# Patient Record
Sex: Female | Born: 1940 | Race: White | Hispanic: No | Marital: Married | Smoking: Never smoker
Health system: Southern US, Community
[De-identification: ages and names within clinical notes are randomized; demographics above are authoritative.]

## PROBLEM LIST (undated history)

## (undated) DIAGNOSIS — M1990 Unspecified osteoarthritis, unspecified site: Secondary | ICD-10-CM

## (undated) DIAGNOSIS — K8020 Calculus of gallbladder without cholecystitis without obstruction: Secondary | ICD-10-CM

## (undated) DIAGNOSIS — K8050 Calculus of bile duct without cholangitis or cholecystitis without obstruction: Secondary | ICD-10-CM

## (undated) DIAGNOSIS — H409 Unspecified glaucoma: Secondary | ICD-10-CM

## (undated) DIAGNOSIS — Z973 Presence of spectacles and contact lenses: Secondary | ICD-10-CM

## (undated) DIAGNOSIS — G250 Essential tremor: Secondary | ICD-10-CM

## (undated) DIAGNOSIS — C4491 Basal cell carcinoma of skin, unspecified: Secondary | ICD-10-CM

## (undated) HISTORY — DX: Basal cell carcinoma of skin, unspecified: C44.91

## (undated) HISTORY — PX: TONSILLECTOMY AND ADENOIDECTOMY: SUR1326

## (undated) HISTORY — PX: WRIST GANGLION EXCISION: SUR520

## (undated) HISTORY — PX: COLONOSCOPY: SHX174

## (undated) HISTORY — PX: TUBAL LIGATION: SHX77

## (undated) HISTORY — DX: Essential tremor: G25.0

---

## 1981-11-05 HISTORY — PX: LUMBAR SPINE SURGERY: SHX701

## 1991-11-06 HISTORY — PX: VAGINAL HYSTERECTOMY: SUR661

## 1999-01-27 ENCOUNTER — Other Ambulatory Visit: Admission: RE | Admit: 1999-01-27 | Discharge: 1999-01-27 | Payer: Self-pay | Admitting: Obstetrics and Gynecology

## 2000-06-07 ENCOUNTER — Encounter: Payer: Self-pay | Admitting: Family Medicine

## 2000-06-07 ENCOUNTER — Encounter: Admission: RE | Admit: 2000-06-07 | Discharge: 2000-06-07 | Payer: Self-pay | Admitting: Family Medicine

## 2000-06-13 ENCOUNTER — Encounter: Admission: RE | Admit: 2000-06-13 | Discharge: 2000-06-13 | Payer: Self-pay | Admitting: Family Medicine

## 2000-06-13 ENCOUNTER — Encounter: Payer: Self-pay | Admitting: Family Medicine

## 2001-03-12 ENCOUNTER — Encounter: Payer: Self-pay | Admitting: Family Medicine

## 2001-03-12 ENCOUNTER — Encounter: Admission: RE | Admit: 2001-03-12 | Discharge: 2001-03-12 | Payer: Self-pay | Admitting: Family Medicine

## 2001-10-16 ENCOUNTER — Ambulatory Visit (HOSPITAL_COMMUNITY): Admission: RE | Admit: 2001-10-16 | Discharge: 2001-10-16 | Payer: Self-pay | Admitting: Gastroenterology

## 2002-01-28 ENCOUNTER — Emergency Department (HOSPITAL_COMMUNITY): Admission: EM | Admit: 2002-01-28 | Discharge: 2002-01-28 | Payer: Self-pay

## 2002-01-28 ENCOUNTER — Encounter: Payer: Self-pay | Admitting: *Deleted

## 2002-06-01 ENCOUNTER — Other Ambulatory Visit: Admission: RE | Admit: 2002-06-01 | Discharge: 2002-06-01 | Payer: Self-pay | Admitting: Obstetrics and Gynecology

## 2003-10-13 ENCOUNTER — Encounter: Admission: RE | Admit: 2003-10-13 | Discharge: 2003-10-13 | Payer: Self-pay | Admitting: Family Medicine

## 2005-12-03 ENCOUNTER — Encounter: Admission: RE | Admit: 2005-12-03 | Discharge: 2005-12-03 | Payer: Self-pay | Admitting: Family Medicine

## 2006-01-07 ENCOUNTER — Encounter: Admission: RE | Admit: 2006-01-07 | Discharge: 2006-01-07 | Payer: Self-pay | Admitting: Neurology

## 2006-08-28 ENCOUNTER — Encounter: Admission: RE | Admit: 2006-08-28 | Discharge: 2006-08-28 | Payer: Self-pay | Admitting: Family Medicine

## 2006-09-09 ENCOUNTER — Encounter: Admission: RE | Admit: 2006-09-09 | Discharge: 2006-09-09 | Payer: Self-pay | Admitting: Family Medicine

## 2011-03-21 ENCOUNTER — Emergency Department (HOSPITAL_COMMUNITY): Payer: Medicare Other

## 2011-03-21 ENCOUNTER — Emergency Department (HOSPITAL_COMMUNITY)
Admission: EM | Admit: 2011-03-21 | Discharge: 2011-03-21 | Disposition: A | Discharge status: Home / Self Care | Payer: Medicare Other | Attending: Emergency Medicine | Admitting: Emergency Medicine

## 2011-03-21 DIAGNOSIS — R5381 Other malaise: Secondary | ICD-10-CM | POA: Insufficient documentation

## 2011-03-21 DIAGNOSIS — G250 Essential tremor: Secondary | ICD-10-CM | POA: Insufficient documentation

## 2011-03-21 DIAGNOSIS — F410 Panic disorder [episodic paroxysmal anxiety] without agoraphobia: Secondary | ICD-10-CM | POA: Insufficient documentation

## 2011-03-21 DIAGNOSIS — R61 Generalized hyperhidrosis: Secondary | ICD-10-CM | POA: Insufficient documentation

## 2011-03-21 DIAGNOSIS — F3289 Other specified depressive episodes: Secondary | ICD-10-CM | POA: Insufficient documentation

## 2011-03-21 DIAGNOSIS — F329 Major depressive disorder, single episode, unspecified: Secondary | ICD-10-CM | POA: Insufficient documentation

## 2011-03-21 DIAGNOSIS — R5383 Other fatigue: Secondary | ICD-10-CM | POA: Insufficient documentation

## 2011-03-21 DIAGNOSIS — R110 Nausea: Secondary | ICD-10-CM | POA: Insufficient documentation

## 2011-03-21 DIAGNOSIS — Z79899 Other long term (current) drug therapy: Secondary | ICD-10-CM | POA: Insufficient documentation

## 2011-03-21 LAB — BASIC METABOLIC PANEL
BUN: 11 mg/dL (ref 6–23)
Calcium: 9.2 mg/dL (ref 8.4–10.5)
Creatinine, Ser: 0.66 mg/dL (ref 0.4–1.2)
GFR calc Af Amer: >60 mL/min (ref >60)
GFR calc non Af Amer: >60 mL/min (ref >60)

## 2011-03-21 LAB — URINALYSIS, ROUTINE W REFLEX MICROSCOPIC
Glucose, UA: NEGATIVE mg/dL
Nitrite: NEGATIVE
Specific Gravity, Urine: 1.013 (ref 1.005–1.030)
pH: 8.5 — ABNORMAL HIGH (ref 5.0–8.0)

## 2011-03-21 LAB — CBC
MCH: 29.5 pg (ref 26.0–34.0)
MCHC: 32.8 g/dL (ref 30.0–36.0)
MCV: 89.8 fL (ref 78.0–100.0)
Platelets: 182 10*3/uL (ref 150–400)
RBC: 4.41 MIL/uL (ref 3.87–5.11)

## 2011-03-21 LAB — DIFFERENTIAL
Basophils Relative: 1 % (ref 0–1)
Eosinophils Absolute: 0.1 10*3/uL (ref 0.0–0.7)
Lymphs Abs: 1.2 10*3/uL (ref 0.7–4.0)
Monocytes Absolute: 0.3 10*3/uL (ref 0.1–1.0)
Monocytes Relative: 8 % (ref 3–12)
Neutrophils Relative %: 59 % (ref 43–77)

## 2011-03-21 LAB — POCT CARDIAC MARKERS: Troponin i, poc: <0.05 ng/mL (ref 0.00–0.09)

## 2011-03-21 LAB — URINE MICROSCOPIC-ADD ON

## 2011-03-23 LAB — URINE CULTURE

## 2012-12-08 ENCOUNTER — Ambulatory Visit: Payer: Medicare Other | Attending: Family Medicine

## 2012-12-08 DIAGNOSIS — M545 Low back pain, unspecified: Secondary | ICD-10-CM | POA: Insufficient documentation

## 2012-12-08 DIAGNOSIS — IMO0001 Reserved for inherently not codable concepts without codable children: Secondary | ICD-10-CM | POA: Insufficient documentation

## 2012-12-08 DIAGNOSIS — M25559 Pain in unspecified hip: Secondary | ICD-10-CM | POA: Insufficient documentation

## 2012-12-11 ENCOUNTER — Ambulatory Visit: Payer: Medicare Other

## 2012-12-15 ENCOUNTER — Ambulatory Visit: Payer: Medicare Other | Admitting: Physical Therapy

## 2012-12-18 ENCOUNTER — Ambulatory Visit: Payer: Medicare Other

## 2012-12-29 ENCOUNTER — Ambulatory Visit: Payer: Medicare Other | Admitting: Physical Therapy

## 2013-01-06 ENCOUNTER — Ambulatory Visit: Payer: Medicare Other | Attending: Family Medicine

## 2013-01-06 DIAGNOSIS — M545 Low back pain, unspecified: Secondary | ICD-10-CM | POA: Insufficient documentation

## 2013-01-06 DIAGNOSIS — M25559 Pain in unspecified hip: Secondary | ICD-10-CM | POA: Insufficient documentation

## 2013-01-06 DIAGNOSIS — IMO0001 Reserved for inherently not codable concepts without codable children: Secondary | ICD-10-CM | POA: Insufficient documentation

## 2013-07-21 ENCOUNTER — Other Ambulatory Visit: Payer: Self-pay

## 2013-07-21 MED ORDER — PRIMIDONE 250 MG PO TABS: 125.0000 mg | ORAL_TABLET | Freq: Every day | ORAL | Status: DC | PRN

## 2013-07-21 NOTE — Telephone Encounter (Signed)
Former [facility] patient assigned to Dr Frances Furbish per [employer].  Last OV was March 2014, patient was asked to return in one year.

## 2014-01-08 ENCOUNTER — Ambulatory Visit: Payer: Self-pay | Admitting: Neurology

## 2014-01-27 ENCOUNTER — Encounter: Payer: Self-pay | Admitting: Neurology

## 2014-01-28 ENCOUNTER — Encounter (INDEPENDENT_AMBULATORY_CARE_PROVIDER_SITE_OTHER): Payer: Self-pay

## 2014-01-28 ENCOUNTER — Encounter: Payer: Self-pay | Admitting: Neurology

## 2014-01-28 ENCOUNTER — Ambulatory Visit (INDEPENDENT_AMBULATORY_CARE_PROVIDER_SITE_OTHER): Payer: Medicare Other | Admitting: Neurology

## 2014-01-28 VITALS — BP 137/72 | HR 85 | Temp 97.2°F | Ht 67.0 in | Wt 120.0 lb

## 2014-01-28 DIAGNOSIS — G252 Other specified forms of tremor: Secondary | ICD-10-CM

## 2014-01-28 DIAGNOSIS — G250 Essential tremor: Secondary | ICD-10-CM

## 2014-01-28 NOTE — Patient Instructions (Signed)
  Please remember, that any kind of tremor may be exacerbated by anxiety, anger, nervousness, excitement, dehydration, sleep deprivation, by caffeine, and low blood sugar values or blood sugar fluctuations. Some medications, especially some antidepressants and lithium can cause or exacerbate tremors. Tremors may temporarily calm down her subside with the use of a benzodiazepine such as Valium or related medications and with alcohol. Be aware however that drinking alcohol is not an approved treatment or appropriate treatment for tremor control and long-term use of benzodiazepines such as Valium, lorazepam, alprazolam, or clonazepam can cause habit formation, physical and psychological addiction.  

## 2014-01-28 NOTE — Progress Notes (Addendum)
Subjective:    Holt ID: April Holt Holt is a 73 y.o. female.  HPI    Interim history:   April Holt Holt is a very pleasant 73 year old right-handed woman with an underlying medical history of seasonal allergies, and glaucoma, who presents for followup consultation of her essential tremor, affecting primarily her head. She is unaccompanied today. This is her first visit with me and she previously followed with Dr. Morene Holt and was last seen by him on 01/06/2013, and which time he felt she was stable and suggested a yearly checkup. She has been on Mysoline for symptomatic treatment of her tremors. Today, she reports feeling stable. No significant new illness is reported, except for glaucoma. She has noted not SE with April Holt primidone and takes 250 mg 1/2 pill in AM.   I reviewed Dr. Tressia Holt prior notes and records and below is a summary of that review: She was first seen by Dr. Erling Holt on 05/13/2000 at April Holt request of Dr. Jennelle Holt for essential tremor. She has a history of head and neck tremor noted while in April Holt choir as a teenager. It has been slowly progressive but has responded well to Mysoline. She has a family history of tremor in her father and paternal grandfather. She has had April Holt tremor for over 50 years, made worse by caffeine and improved with Mysoline. She has been on April Holt Mysoline since 2001. She had left leg pain in March 2007 and had a lumbar MRI, which showed facet arthropathy bilaterally at L2-3 to L4-5 with asymmetry at L3-4 and more pronounced arthropathy on April Holt left, creating a mild lateral recess stenosis at that level, which might entrap April Holt left L4 nerve root, and left L5-S1 hemilaminectomy defect. 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 On 01/13/2008, she had normal vitamin D, CBC, and CMP. In October 2010 she was sitting, writing at her desk and felt like she was going to faint. It went away in seconds. This occurred once at age 30 years.April Holt episode in October was unusual. She felt a  sensation as if she couldn't catch her breath and she had to let her neck go down and head go down. One week after April Holt episode in October, she had a strange sensation In her throat and was seen by a cardiologist. She underwent a 24 hour monitor which was normal. She has not had any syncopal episodes since then. DEXA scan in 2010 showed osteopenia in April Holt left forearm. Repeat 2013 showed improvement. Blood work on 12/08/2010 showed normal glucose and urinalysis. She exercises by doing yoga and walking. She had anxiety in 01/2011 and was placed on 10 mg of citalpropram per day. At April Holt time she was selling her house. She weaned off of citalopram as she was noted to have a head tremor. She denies side effects from mysoline. She is a retired Pharmacist, hospital. 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 She is independent in her ADLs. She denies memory loss, depression, delirium, hallucinations, anxiety,or falling episodes.  Her Past Medical History Is Significant For: Past Medical History  Diagnosis Date  . Disturbance of skin sensation   . Lumbosacral root lesions, not elsewhere classified   . Essential and other specified forms of tremor   . Essential tremor 01/28/2014    Her Past Surgical History Is Significant For: Past Surgical History  Procedure Laterality Date  . Vaginal hysterectomy    . Lumbar spine surgery  1983    L5-S1  . Tonsilectomy/adenoidectomy with myringotomy  Her Family History Is Significant For: Family History  Problem Relation Age of Onset  . Heart failure Father   . Stroke Mother   . Asthma Sister     Her Social History Is Significant For: History   Social History  . Marital Status: Married    Spouse Name: N/A    Number of Children: N/A  . Years of Education: N/A   Occupational History  . retired    Social History Main Topics  . Smoking status: Never Smoker   . Smokeless tobacco: None  . Alcohol Use: 2.0 oz/week    4 drink(s) per week  . Drug Use: No  . Sexual Activity: None    Other Topics Concern  . None   Social History Narrative   Pt lives at home with her husband of 23 years, April Holt Holt; they have 6 children. 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    Her Allergies Are:  Allergies  Allergen Reactions  . Penicillins   . Sulfa Antibiotics   :   Her Current Medications Are:  Outpatient Encounter Prescriptions as of 01/28/2014  Medication Sig  . latanoprost (XALATAN) 0.005 % ophthalmic solution Place 1 drop into both eyes daily.  . naproxen (NAPROSYN) 500 MG tablet Take 2 tablets by mouth daily.  . primidone (MYSOLINE) 250 MG tablet Take 0.5-1 tablets (125-250 mg total) by mouth daily as needed.  . [DISCONTINUED] PHENOBARBITAL PO Take by mouth.  :  Review of Systems:  Out of a complete 14 point review of systems, all are reviewed and negative with April Holt exception of these symptoms as listed below:  Review of Systems  Constitutional: Negative.   HENT: Positive for rhinorrhea.   Eyes: Negative.   Respiratory: Negative.   Cardiovascular: Positive for leg swelling.  Gastrointestinal: Negative.   Endocrine: Negative.   Genitourinary: Negative.   Musculoskeletal: Positive for arthralgias.  Skin: Negative.   Allergic/Immunologic: Positive for environmental allergies.  Neurological: Positive for tremors.  Hematological: Bruises/bleeds easily.  Psychiatric/Behavioral: Negative.     Objective:  Neurologic Exam  Physical Exam Physical Examination:   Filed Vitals:   01/28/14 1517  BP: 137/72  Pulse: 85  Temp: 97.2 F (36.2 C)    General Examination: April Holt Holt is a very pleasant 73 y.o. female in no acute distress. She appears well-developed and well-nourished and well groomed.    HEENT: Normocephalic, atraumatic, pupils are equal, round and reactive to light and accommodation. Funduscopic exam is normal with sharp disc margins noted. Extraocular tracking is good without limitation to gaze excursion or nystagmus noted. Normal smooth  pursuit is noted. Hearing is grossly intact. Tympanic membranes are clear bilaterally. Face is symmetric with normal facial animation and normal facial sensation. Speech is clear with no dysarthria noted. There is no hypophonia. There is a mild, yes-yes type neck/head tremor and a slight voice tremor. Neck is supple with full range of passive and active motion. There are no carotid bruits on auscultation. Oropharynx exam reveals: mild mouth dryness, adequate dental hygiene and no significant airway crowding. Mallampati is class I. Tongue protrudes centrally and palate elevates symmetrically. Tonsils are absent.    Chest: Clear to auscultation without wheezing, rhonchi or crackles noted.  Heart: S1+S2+0, regular and normal without murmurs, rubs or gallops noted.   Abdomen: Soft, non-tender and non-distended with normal bowel sounds appreciated on auscultation.  Extremities: There is trace pitting edema in April Holt distal lower extremities bilaterally, around April Holt ankles, L>R. Pedal pulses are intact.  Skin: Warm and dry without trophic changes noted. There are no varicose veins.  Musculoskeletal: exam reveals no obvious joint deformities, tenderness or joint swelling or erythema.   Neurologically:  Mental status: April Holt Holt is awake, alert and oriented in all 4 spheres. Her immediate and remote memory, attention, language skills and fund of knowledge are appropriate. There is no evidence of aphasia, agnosia, apraxia or anomia. Speech is clear with normal prosody and enunciation. Thought process is linear. Mood is normal and affect is normal.  Cranial nerves II - XII are as described above under HEENT exam. In addition: shoulder shrug is normal with equal shoulder height noted. Motor exam: Normal bulk, strength and tone is noted. There is no drift, tremor or rebound. Romberg is negative. Reflexes are 2+ throughout. Babinski: Toes are flexor bilaterally. Fine motor skills and coordination: intact with  normal finger taps, normal hand movements, normal rapid alternating patting, normal foot taps and normal foot agility.  Cerebellar testing: No dysmetria or intention tremor on finger to nose testing. Heel to shin is unremarkable bilaterally. There is no truncal or gait ataxia.  Sensory exam: intact to light touch, pinprick, vibration, temperature sense and proprioception in April Holt upper and lower extremities.  Gait, station and balance: She stands easily. No veering to one side is noted. No leaning to one side is noted. Posture is age-appropriate and stance is narrow based. Gait shows normal stride length and normal pace. No problems turning are noted. She turns en bloc. Tandem walk is unremarkable. Intact toe and heel stance is noted.                Assessment and Plan:   In summary, April Holt Holt is a very pleasant 73 y.o.-year old female with a fairly benign past medical history except for glaucoma, who has a long-standing history of essential tremor which manifests primarily as a head tremor. She has a positive family history of tremors. Her physical exam appears to be quite stable when I compare my findings with last years findings when she saw Dr. Erling Holt. She has had reasonably good results with Mysoline 125 mg daily and reports no side effects at this time. I would like to continue with April Holt current medication and suggested a yearly checkup as she continues to do well. She was in agreement. She did not need a refill today but I will take care of it when it comes to renewing her prescription. I encouraged her to call with any interim questions, concerns, problems, updates or refill requests.

## 2014-07-26 ENCOUNTER — Other Ambulatory Visit: Payer: Self-pay | Admitting: Neurology

## 2015-02-01 ENCOUNTER — Ambulatory Visit: Payer: Medicare Other | Admitting: Neurology

## 2015-02-08 ENCOUNTER — Ambulatory Visit (INDEPENDENT_AMBULATORY_CARE_PROVIDER_SITE_OTHER): Payer: Medicare Other | Admitting: Neurology

## 2015-02-08 ENCOUNTER — Encounter: Payer: Self-pay | Admitting: Neurology

## 2015-02-08 VITALS — BP 103/60 | HR 80 | Temp 97.9°F | Resp 14 | Ht 67.0 in | Wt 124.0 lb

## 2015-02-08 DIAGNOSIS — G250 Essential tremor: Secondary | ICD-10-CM | POA: Diagnosis not present

## 2015-02-08 MED ORDER — PRIMIDONE 250 MG PO TABS: ORAL_TABLET | ORAL | Status: DC

## 2015-02-08 NOTE — Progress Notes (Signed)
Subjective:    Patient ID: April Holt is a 74 y.o. female.  HPI     Interim history:   April Holt is a very pleasant 74 year old right-handed woman with an underlying medical history of seasonal allergies, and glaucoma, who presents for followup consultation of her essential tremor, affecting primarily her head. She is unaccompanied today. I first met her on 01/28/2014, at which time she reported feeling stable. She had no new illness but was diagnosed with glaucoma. She had no side effects with the primidone and was taking 250 mg strength half a pill in the morning.   Today, 02/08/2015: She reports feeling stable as far as her tremors concerned. She has not noticed any significant worsening of her tremor. She plays the heart successfully still. She has not noted any new hand tremors. Fine motor skills are fine. Balance is sometimes a little off but she has had no major problems and certainly no falls. In December she tripped off a curb. This was because it was wet leaves on the curb and she didn't see the edge. A few weeks ago she developed a crick in her neck. This has improved. She has been using heat and ice. She sees Dr. Prudencio Burly for her glaucoma.  Previously: She previously followed with Dr. Morene Antu and was last seen by him on 01/06/2013, and which time he felt she was stable and suggested a yearly checkup. She has been on Mysoline for symptomatic treatment of her tremors.  I reviewed Dr. Tressia Danas prior notes and records and below is a summary of that review: She was first seen by Dr. Erling Cruz on 05/13/2000 at the request of Dr. Jennelle Human for essential tremor. She has a history of head and neck tremor noted while in the choir as a teenager. It has been slowly progressive but has responded well to Mysoline. She has a family history of tremor in her father and paternal grandfather. She has had the tremor for over 50 years, made worse by caffeine and improved with Mysoline. She has been on the  Mysoline since 2001. 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 On 01/13/2008, she had normal vitamin D, CBC, and CMP. In October 2010 she was sitting, writing at her desk and felt like she was going to faint. It went away in seconds. This occurred once at age 82 years.The episode in October was unusual. She felt a sensation as if she couldn't catch her breath and she had to let her neck go down and head go down. One week after the episode in October, she had a strange sensation In her throat and was seen by a cardiologist. She underwent a 24 hour monitor which was normal. She has not had any syncopal episodes since then. 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 Repeat 2013 showed improvement. Blood work on 12/08/2010 showed normal glucose and urinalysis. She exercises by doing yoga and walking. She had anxiety in 01/2011 and was placed on 10 mg of citalpropram per day. At the time she was selling her house. She weaned off of citalopram as she was noted to have a head tremor. She denies side  effects from mysoline. 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 She is independent in her ADLs. She denies memory loss, depression, delirium, hallucinations, anxiety,or falling episodes.  Her Past Medical History Is Significant For: Past Medical History  Diagnosis Date  . Disturbance of skin sensation   . Lumbosacral root lesions, not elsewhere classified   . Essential and other specified forms of tremor   . Essential tremor 01/28/2014    Her Past Surgical History Is Significant For: Past Surgical History  Procedure Laterality Date  . Vaginal hysterectomy    . Lumbar  spine surgery  1983    L5-S1  . Tonsilectomy/adenoidectomy with myringotomy      Her Family History Is Significant For: Family History  Problem Relation Age of Onset  . Heart failure Father   . Stroke Mother   . Asthma Sister     Her Social History Is Significant For: History   Social History  . Marital Status: Married    Spouse Name: N/A  . Number of Children: N/A  . Years of Education: N/A   Occupational History  . retired    Social History Main Topics  . Smoking status: Never Smoker   . Smokeless tobacco: Not on file  . Alcohol Use: 2.0 oz/week    4 drink(s) per week  . Drug Use: No  . Sexual Activity: Not on file   Other Topics Concern  . None   Social History Narrative   Pt lives at home with her husband of 23 years, April Holt; they have 6 children. 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    Her Allergies Are:  Allergies  Allergen Reactions  . Penicillins   . Sulfa Antibiotics   :   Her Current Medications Are:  Outpatient Encounter Prescriptions as of 02/08/2015  Medication Sig  . latanoprost (XALATAN) 0.005 % ophthalmic solution Place 1 drop into both eyes daily.  . primidone (MYSOLINE) 250 MG tablet TAKE 1/2 TO 1 TABLET DAILY AS NEEDED.  . [DISCONTINUED] naproxen (NAPROSYN) 500 MG tablet Take 2 tablets by mouth daily.  :  Review of Systems:  Out of a complete 14 point review of systems, all are reviewed and negative with the exception of these symptoms as listed below:   Review of Systems  Neurological:       Recent "catch" in neck after exercising     Objective:  Neurologic Exam  Physical Exam Physical Examination:   Filed Vitals:   02/08/15 0810  BP: 103/60  Pulse: 80  Temp: 97.9 F (36.6 C)  Resp: 14    General Examination: The patient is a very pleasant 74 y.o. female in no acute distress. She appears well-developed and well-nourished and very well groomed.    HEENT: Normocephalic, atraumatic, pupils are equal, round  and reactive to light and accommodation. Funduscopic exam is normal with sharp disc margins noted. Extraocular tracking is good without limitation to gaze excursion or nystagmus noted. Normal smooth pursuit is noted. Hearing is grossly intact. Face is symmetric with normal facial animation and normal facial sensation. Speech is clear with no dysarthria noted. There is no hypophonia. There is a mild, yes-yes type neck/head tremor and a slight voice tremor. Neck is supple with full range of passive motion, but she has some tenderness with neck turning to the sides. There are no carotid bruits on auscultation. Oropharynx exam reveals: mild mouth dryness, adequate dental hygiene and no significant airway crowding. Mallampati is class I.  Tongue protrudes centrally and palate elevates symmetrically. Tonsils are absent.    Chest: Clear to auscultation without wheezing, rhonchi or crackles noted.  Heart: S1+S2+0, regular and normal without murmurs, rubs or gallops noted.   Abdomen: Soft, non-tender and non-distended with normal bowel sounds appreciated on auscultation.  Extremities: There is puffiness but nonpitting edema in the distal lower extremities bilaterally, L>R, unchanged. Pedal pulses are intact.  Skin: Warm and dry without trophic changes noted. There are no varicose veins.  Musculoskeletal: exam reveals no obvious joint deformities, tenderness or joint swelling or erythema.   Neurologically:  Mental status: The patient is awake, alert and oriented in all 4 spheres. Her immediate and remote memory, attention, language skills and fund of knowledge are appropriate. There is no evidence of aphasia, agnosia, apraxia or anomia. Speech is clear with normal prosody and enunciation. Thought process is linear. Mood is normal and affect is normal.  Cranial nerves II - XII are as described above under HEENT exam. In addition: shoulder shrug is normal with equal shoulder height noted. Motor exam: Normal  bulk, strength and tone is noted. There is no drift, tremor or rebound. Romberg is negative. Reflexes are 2+ throughout. Babinski: Toes are flexor bilaterally. Fine motor skills and coordination: intact with normal finger taps, normal hand movements, normal rapid alternating patting, normal foot taps and normal foot agility.  Cerebellar testing: No dysmetria or intention tremor on finger to nose testing. Heel to shin is unremarkable bilaterally. There is no truncal or gait ataxia.  Sensory exam: intact to light touch, pinprick, vibration, temperature sense and proprioception in the upper and lower extremities.  Gait, station and balance: She stands easily. No veering to one side is noted. No leaning to one side is noted. Posture is age-appropriate and stance is narrow based. Gait shows normal stride length and normal pace. No problems turning are noted. She turns en bloc. Tandem walk is unremarkable. Intact toe and heel stance is noted.                Assessment and Plan:   In summary, April Holt is a very pleasant 74 year old female with a fairly benign past medical history except for allergies and glaucoma, who has a long-standing history of essential tremor which manifests primarily as a head tremor. She has a positive family history of tremors as well (father, who lived to be 81). Her physical exam continues to be stable from last year. She has had reasonably good results with Mysoline 125 mg daily (at times she takes 250 mg, especially prior to playing the harp in public) and reports no side effects. I would like to continue with the current medication and suggested a yearly checkup as she continues to do well. She was in agreement. I renewed her 90 day prescription today. I encouraged her to call with any interim questions, concerns, problems, updates or refill requests. I spent 15 minutes in total face-to-face time with the patient, more than 50% of which was spent in counseling and coordination of  care, reviewing test results, reviewing medication and discussing or reviewing the diagnosis of ET, its prognosis and treatment options.

## 2015-02-08 NOTE — Patient Instructions (Signed)
  Please remember, that any kind of tremor may be exacerbated by anxiety, anger, nervousness, excitement, dehydration, sleep deprivation, by caffeine, and low blood sugar values or blood sugar fluctuations. Some medications, especially some antidepressants and lithium can cause or exacerbate tremors. Tremors may temporarily calm down her subside with the use of a benzodiazepine such as Valium or related medications and with alcohol. Be aware however that drinking alcohol is not an approved treatment or appropriate treatment for tremor control and long-term use of benzodiazepines such as Valium, lorazepam, alprazolam, or clonazepam can cause habit formation, physical and psychological addiction.

## 2016-01-23 ENCOUNTER — Other Ambulatory Visit: Payer: Self-pay | Admitting: Orthopedic Surgery

## 2016-02-08 ENCOUNTER — Encounter: Payer: Self-pay | Admitting: Neurology

## 2016-02-08 ENCOUNTER — Ambulatory Visit (INDEPENDENT_AMBULATORY_CARE_PROVIDER_SITE_OTHER): Payer: Medicare Other | Admitting: Neurology

## 2016-02-08 VITALS — BP 112/60 | HR 72 | Resp 16 | Ht 67.0 in | Wt 122.0 lb

## 2016-02-08 DIAGNOSIS — M25472 Effusion, left ankle: Secondary | ICD-10-CM

## 2016-02-08 DIAGNOSIS — G250 Essential tremor: Secondary | ICD-10-CM | POA: Diagnosis not present

## 2016-02-08 MED ORDER — PRIMIDONE 250 MG PO TABS: ORAL_TABLET | ORAL | Status: DC

## 2016-02-08 NOTE — Progress Notes (Signed)
Subjective:    Patient ID: April Holt is a 75 y.o. female.  HPI     Interim history:  April Holt is a very pleasant 75 year old right-handed woman with an underlying medical history of seasonal allergies, and glaucoma, who presents for followup consultation of her essential tremor, affecting primarily her head. She is unaccompanied today. I last saw her on 02/08/2015, at which time she reported feeling stable. She noticed no significant worsening of her tremors. She was playing the harp. She did not notice any significant hand tremors. She felt her fine motor skills are fine. Balance at times was not as good but she had no recent falls and no major problems. She did trip off a curb in December 2015. This was because the ground was wet and never leaves on the curb and she did not see the edge of it. She also reported a recent neck pain with a crick in the neck. She used heat and ice on it. I suggested we continue with 125 mg daily, up to 250 mg if needed.   Today, 02/08/2016: She reports doing well with regards to her tremors. Still taking half a pill of Mysoline once a day, occasionally a whole pill. She has been tolerating it well, she has not noticed any exacerbation of her tremors and no significant hand tremors. She has been working on her balance and has been doing yoga. She has had some gallbladder problems. She is to see a Psychologist, sport and exercise for this.   Previously:  I first met her on 01/28/2014, at which time she reported feeling stable. She had no new illness but was diagnosed with glaucoma. She had no side effects with the primidone and was taking 250 mg strength half a pill in the morning.    She previously followed with Dr. Morene Antu and was last seen by him on 01/06/2013, and which time he felt she was stable and suggested a yearly checkup. She has been on Mysoline for symptomatic treatment of her tremors.  I reviewed Dr. Tressia Danas prior notes and records and below is a summary of that  review: She was first seen by Dr. Erling Cruz on 05/13/2000 at the request of Dr. Jennelle Human for essential tremor. She has a history of head and neck tremor noted while in the choir as a teenager. It has been slowly progressive but has responded well to Mysoline. She has a family history of tremor in her father and paternal grandfather. She has had the tremor for over 50 years, made worse by caffeine and improved with Mysoline. She has been on the Mysoline since 2001. 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 On 01/13/2008, she had normal vitamin D, CBC, and CMP. In October 2010 she was sitting, writing at her desk and felt like she was going to faint. It went away in seconds. This occurred once at age 63 years.The episode in October was unusual. She felt a sensation as if she couldn't catch her breath and she had to let her neck go down and head go down. One week after the episode in October, she had a strange sensation In her throat and was seen by  a cardiologist. She underwent a 24 hour monitor which was normal. She has not had any syncopal episodes since then. 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 Repeat 2013 showed improvement. Blood work on 12/08/2010 showed normal glucose and urinalysis. She exercises by doing yoga and walking. She had anxiety in 01/2011 and was placed on 10 mg of citalpropram per day. At the time she was selling her house. She weaned off of citalopram as she was noted to have a head tremor. She denies side effects from mysoline. 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 She is independent in her ADLs. She denies memory loss, depression, delirium,  hallucinations, anxiety,or falling episodes.   Her Past Medical History Is Significant For: Past Medical History  Diagnosis Date  . Disturbance of skin sensation   . Lumbosacral root lesions, not elsewhere classified   . Essential and other specified forms of tremor   . Essential tremor 01/28/2014    Her Past Surgical History Is Significant For: Past Surgical History  Procedure Laterality Date  . Vaginal hysterectomy    . Lumbar spine surgery  1983    L5-S1  . Tonsilectomy/adenoidectomy with myringotomy      Her Family History Is Significant For: Family History  Problem Relation Age of Onset  . Heart failure Father   . Stroke Mother   . Asthma Sister     Her Social History Is Significant For: Social History   Social History  . Marital Status: Married    Spouse Name: N/A  . Number of Children: N/A  . Years of Education: N/A   Occupational History  . retired    Social History Main Topics  . Smoking status: Never Smoker   . Smokeless tobacco: None  . Alcohol Use: 2.0 oz/week    4 drink(s) per week  . Drug Use: No  . Sexual Activity: Not Asked   Other Topics Concern  . None   Social History Narrative   Pt lives at home with her husband of 23 years, Elisha Mcgruder; they have 6 children. 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    Her Allergies Are:  Allergies  Allergen Reactions  . Penicillins   . Sulfa Antibiotics   :   Her Current Medications Are:  Outpatient Encounter Prescriptions as of 02/08/2016  Medication Sig  . conjugated estrogens (PREMARIN) vaginal cream   . latanoprost (XALATAN) 0.005 % ophthalmic solution Place 1 drop into both eyes daily.  . primidone (MYSOLINE) 250 MG tablet TAKE 1/2 TO 1 TABLET DAILY AS NEEDED.   No facility-administered encounter medications on file as of 02/08/2016.  :  Review of Systems:  Out of a complete 14 point review of systems, all are reviewed and negative with the exception of these symptoms as listed  below:   Review of Systems  Neurological:       Patient is here for f/u, no new concerns per patient.     Objective:  Neurologic Exam  Physical Exam Physical Examination:   Filed Vitals:   02/08/16 1130  BP: 112/60  Pulse: 72  Resp: 16    General Examination: The patient is a very pleasant 75 y.o. female in no acute distress. She appears well-developed and well-nourished and very well groomed. She is in good spirits today.   HEENT: Normocephalic, atraumatic, pupils are equal, round and reactive to light and accommodation. Extraocular tracking is good without limitation to gaze excursion or nystagmus noted. Normal  smooth pursuit is noted. Hearing is grossly intact. Face is symmetric with normal facial animation and normal facial sensation. Speech is clear with no dysarthria noted. There is no hypophonia. There is a mild, yes-yes type neck/head tremor and a slight voice tremor. Neck is supple with full range of passive motion, but she has some tenderness with neck turning to the sides. There are no carotid bruits on auscultation. Oropharynx exam reveals: mild mouth dryness, adequate dental hygiene and no significant airway crowding. Mallampati is class I. Tongue protrudes centrally and palate elevates symmetrically. Tonsils are absent.    Chest: Clear to auscultation without wheezing, rhonchi or crackles noted.  Heart: S1+S2+0, regular and normal without murmurs, rubs or gallops noted.   Abdomen: Soft, non-tender and non-distended with normal bowel sounds appreciated on auscultation.  Extremities: There is 1+ edema in the left ankle which is not new, swells up from time to time she says. Pedal pulses are intact.  Skin: Warm and dry without trophic changes noted. There are no varicose veins.  Musculoskeletal: exam reveals no obvious joint deformities, tenderness or joint swelling or erythema.   Neurologically:  Mental status: The patient is awake, alert and oriented in all 4  spheres. Her immediate and remote memory, attention, language skills and fund of knowledge are appropriate. There is no evidence of aphasia, agnosia, apraxia or anomia. Speech is clear with normal prosody and enunciation. Thought process is linear. Mood is normal and affect is normal.  Cranial nerves II - XII are as described above under HEENT exam. In addition: shoulder shrug is normal with equal shoulder height noted. Motor exam: Normal bulk, strength and tone is noted. There is no drift, rest tremor or rebound. She has no significant action or postural tremor in her upper extremities. Romberg is negative. Reflexes are 2+ throughout. Fine motor skills and coordination: intact with normal finger taps, normal hand movements, normal rapid alternating patting, normal foot taps and normal foot agility.  Cerebellar testing: No dysmetria or intention tremor on finger to nose testing. Heel to shin is unremarkable bilaterally. There is no truncal or gait ataxia.  Sensory exam: intact to light touch in the upper and lower extremities.  Gait, station and balance: She stands easily. No veering to one side is noted. No leaning to one side is noted. Posture is age-appropriate and stance is narrow based. Gait shows normal stride length and normal pace. No problems turning are noted. She turns en bloc. Tandem walk is slightly challanging for her.                 Assessment and Plan:   In summary, April Holt is a very pleasant 75 year old female with a fairly benign past medical history except for allergies and glaucoma, who has a long-standing history of essential tremor which manifests primarily as a head tremor. She has a positive family history of tremors as well (father, who lived to be 64). Her physical exam continues to be stable from last year. She has had reasonably good results with Mysoline 125 mg daily (at times she takes 250 mg, especially prior to playing the harp in public) and reports no side effects.  She has had some interim gallbladder problems, may need a cholecystectomy. From my end of things, she can continue with Mysoline at the current dose. I renewed her 90 day prescription. Since she has been doing well, I suggested a one-year checkup. I answered all her questions today and she was in agreement. I  spent 20 minutes in total face-to-face time with the patient, more than 50% of which was spent in counseling and coordination of care, reviewing test results, reviewing medication and discussing or reviewing the diagnosis of ET, its prognosis and treatment options.

## 2016-02-08 NOTE — Patient Instructions (Addendum)
We will keep your mysoline the same, your exam is stable!

## 2016-02-09 ENCOUNTER — Ambulatory Visit: Payer: Self-pay | Admitting: General Surgery

## 2016-03-12 ENCOUNTER — Encounter (HOSPITAL_BASED_OUTPATIENT_CLINIC_OR_DEPARTMENT_OTHER): Payer: Self-pay | Admitting: *Deleted

## 2016-03-13 DIAGNOSIS — K8010 Calculus of gallbladder with chronic cholecystitis without obstruction: Secondary | ICD-10-CM | POA: Diagnosis not present

## 2016-03-13 DIAGNOSIS — K8020 Calculus of gallbladder without cholecystitis without obstruction: Secondary | ICD-10-CM | POA: Diagnosis present

## 2016-03-13 DIAGNOSIS — Z79890 Hormone replacement therapy (postmenopausal): Secondary | ICD-10-CM | POA: Diagnosis not present

## 2016-03-13 DIAGNOSIS — Z79899 Other long term (current) drug therapy: Secondary | ICD-10-CM | POA: Diagnosis not present

## 2016-03-13 LAB — CBC WITH DIFFERENTIAL/PLATELET
BASOS ABS: 0 10*3/uL (ref 0.0–0.1)
BASOS PCT: 1 %
EOS ABS: 0.1 10*3/uL (ref 0.0–0.7)
Eosinophils Relative: 3 %
HEMATOCRIT: 41 % (ref 36.0–46.0)
HEMOGLOBIN: 13.5 g/dL (ref 12.0–15.0)
Lymphocytes Relative: 41 %
Lymphs Abs: 2.2 10*3/uL (ref 0.7–4.0)
MCH: 29.3 pg (ref 26.0–34.0)
MCHC: 32.9 g/dL (ref 30.0–36.0)
MCV: 89.1 fL (ref 78.0–100.0)
MONOS PCT: 8 %
Monocytes Absolute: 0.4 10*3/uL (ref 0.1–1.0)
NEUTROS ABS: 2.5 10*3/uL (ref 1.7–7.7)
NEUTROS PCT: 47 %
Platelets: 232 10*3/uL (ref 150–400)
RBC: 4.6 MIL/uL (ref 3.87–5.11)
RDW: 12.8 % (ref 11.5–15.5)
WBC: 5.2 10*3/uL (ref 4.0–10.5)

## 2016-03-13 LAB — COMPREHENSIVE METABOLIC PANEL
ALT: 26 U/L (ref 14–54)
ANION GAP: 7 (ref 5–15)
AST: 29 U/L (ref 15–41)
Albumin: 4.2 g/dL (ref 3.5–5.0)
Alkaline Phosphatase: 41 U/L (ref 38–126)
BILIRUBIN TOTAL: 0.4 mg/dL (ref 0.3–1.2)
BUN: 10 mg/dL (ref 6–20)
CHLORIDE: 106 mmol/L (ref 101–111)
CO2: 29 mmol/L (ref 22–32)
Calcium: 9.2 mg/dL (ref 8.9–10.3)
Creatinine, Ser: 0.79 mg/dL (ref 0.44–1.00)
GFR calc Af Amer: >60 mL/min (ref >60)
Glucose, Bld: 106 mg/dL — ABNORMAL HIGH (ref 65–99)
POTASSIUM: 4.3 mmol/L (ref 3.5–5.1)
SODIUM: 142 mmol/L (ref 135–145)
TOTAL PROTEIN: 6.6 g/dL (ref 6.5–8.1)

## 2016-03-20 ENCOUNTER — Ambulatory Visit (HOSPITAL_BASED_OUTPATIENT_CLINIC_OR_DEPARTMENT_OTHER): Admit: 2016-03-20 | Payer: Medicare Other | Admitting: Orthopedic Surgery

## 2016-03-20 ENCOUNTER — Encounter (HOSPITAL_BASED_OUTPATIENT_CLINIC_OR_DEPARTMENT_OTHER): Payer: Self-pay

## 2016-03-20 SURGERY — EXCISION MASS
Anesthesia: Regional | Site: Finger | Laterality: Left

## 2016-03-21 ENCOUNTER — Ambulatory Visit (HOSPITAL_BASED_OUTPATIENT_CLINIC_OR_DEPARTMENT_OTHER): Payer: Medicare Other | Admitting: Anesthesiology

## 2016-03-21 ENCOUNTER — Encounter (HOSPITAL_BASED_OUTPATIENT_CLINIC_OR_DEPARTMENT_OTHER): Payer: Self-pay

## 2016-03-21 ENCOUNTER — Ambulatory Visit (HOSPITAL_BASED_OUTPATIENT_CLINIC_OR_DEPARTMENT_OTHER)
Admission: RE | Admit: 2016-03-21 | Discharge: 2016-03-21 | Disposition: A | Discharge status: Home / Self Care | Payer: Medicare Other | Source: Ambulatory Visit | Attending: General Surgery | Admitting: General Surgery

## 2016-03-21 ENCOUNTER — Encounter (HOSPITAL_BASED_OUTPATIENT_CLINIC_OR_DEPARTMENT_OTHER)
Admission: RE | Disposition: A | Discharge status: Home / Self Care | Payer: Self-pay | Source: Ambulatory Visit | Attending: General Surgery

## 2016-03-21 ENCOUNTER — Other Ambulatory Visit: Payer: Self-pay

## 2016-03-21 DIAGNOSIS — K8010 Calculus of gallbladder with chronic cholecystitis without obstruction: Secondary | ICD-10-CM | POA: Diagnosis not present

## 2016-03-21 DIAGNOSIS — Z79890 Hormone replacement therapy (postmenopausal): Secondary | ICD-10-CM | POA: Insufficient documentation

## 2016-03-21 DIAGNOSIS — Z79899 Other long term (current) drug therapy: Secondary | ICD-10-CM | POA: Insufficient documentation

## 2016-03-21 HISTORY — DX: Unspecified osteoarthritis, unspecified site: M19.90

## 2016-03-21 HISTORY — DX: Calculus of bile duct without cholangitis or cholecystitis without obstruction: K80.50

## 2016-03-21 HISTORY — DX: Calculus of gallbladder without cholecystitis without obstruction: K80.20

## 2016-03-21 HISTORY — PX: CHOLECYSTECTOMY: SHX55

## 2016-03-21 HISTORY — DX: Presence of spectacles and contact lenses: Z97.3

## 2016-03-21 HISTORY — DX: Unspecified glaucoma: H40.9

## 2016-03-21 SURGERY — LAPAROSCOPIC CHOLECYSTECTOMY
Anesthesia: General | Site: Abdomen

## 2016-03-21 MED ORDER — LACTATED RINGERS IV SOLN
INTRAVENOUS | Status: DC
  Administered 2016-03-21 (×2): via INTRAVENOUS
  Filled 2016-03-21: qty 1000

## 2016-03-21 MED ORDER — FENTANYL CITRATE (PF) 250 MCG/5ML IJ SOLN
INTRAMUSCULAR | Status: AC
  Filled 2016-03-21: qty 5

## 2016-03-21 MED ORDER — METRONIDAZOLE IN NACL 5-0.79 MG/ML-% IV SOLN
500.0000 mg | INTRAVENOUS | Status: AC
  Administered 2016-03-21 (×2): 500 mg via INTRAVENOUS
  Filled 2016-03-21: qty 100

## 2016-03-21 MED ORDER — PROPOFOL 10 MG/ML IV BOLUS
INTRAVENOUS | Status: AC
  Filled 2016-03-21: qty 20

## 2016-03-21 MED ORDER — DEXAMETHASONE SODIUM PHOSPHATE 10 MG/ML IJ SOLN
INTRAMUSCULAR | Status: AC
  Filled 2016-03-21: qty 1

## 2016-03-21 MED ORDER — CIPROFLOXACIN IN D5W 400 MG/200ML IV SOLN
400.0000 mg | INTRAVENOUS | Status: AC
  Administered 2016-03-21: 400 mg via INTRAVENOUS
  Filled 2016-03-21: qty 200

## 2016-03-21 MED ORDER — ROCURONIUM BROMIDE 100 MG/10ML IV SOLN
INTRAVENOUS | Status: DC | PRN
  Administered 2016-03-21: 25 mg via INTRAVENOUS

## 2016-03-21 MED ORDER — METRONIDAZOLE IN NACL 5-0.79 MG/ML-% IV SOLN
INTRAVENOUS | Status: AC
  Filled 2016-03-21: qty 100

## 2016-03-21 MED ORDER — ONDANSETRON HCL 4 MG/2ML IJ SOLN
INTRAMUSCULAR | Status: DC | PRN
Start: 2016-03-21 — End: 2016-03-21
  Administered 2016-03-21: 4 mg via INTRAVENOUS

## 2016-03-21 MED ORDER — PROPOFOL 10 MG/ML IV BOLUS
INTRAVENOUS | Status: DC | PRN
  Administered 2016-03-21: 130 mg via INTRAVENOUS

## 2016-03-21 MED ORDER — HYDROCODONE-ACETAMINOPHEN 5-325 MG PO TABS: 1.0000 | ORAL_TABLET | Freq: Four times a day (QID) | ORAL | Status: DC | PRN

## 2016-03-21 MED ORDER — MIDAZOLAM HCL 2 MG/2ML IJ SOLN
INTRAMUSCULAR | Status: AC
  Filled 2016-03-21: qty 2

## 2016-03-21 MED ORDER — LIDOCAINE HCL (CARDIAC) 20 MG/ML IV SOLN
INTRAVENOUS | Status: DC | PRN
  Administered 2016-03-21: 50 mg via INTRAVENOUS

## 2016-03-21 MED ORDER — SUGAMMADEX SODIUM 200 MG/2ML IV SOLN
INTRAVENOUS | Status: AC
  Filled 2016-03-21: qty 2

## 2016-03-21 MED ORDER — BUPIVACAINE-EPINEPHRINE 0.25% -1:200000 IJ SOLN
INTRAMUSCULAR | Status: DC | PRN
  Administered 2016-03-21: 30 mL

## 2016-03-21 MED ORDER — NEOSTIGMINE METHYLSULFATE 10 MG/10ML IV SOLN
INTRAVENOUS | Status: DC | PRN
  Administered 2016-03-21: 1 mg via INTRAVENOUS

## 2016-03-21 MED ORDER — SODIUM CHLORIDE 0.9 % IR SOLN
Status: DC | PRN
  Administered 2016-03-21: 1000 mL

## 2016-03-21 MED ORDER — SCOPOLAMINE 1 MG/3DAYS TD PT72
1.0000 | MEDICATED_PATCH | TRANSDERMAL | Status: DC
  Administered 2016-03-21: 1.5 mg via TRANSDERMAL
  Filled 2016-03-21: qty 1

## 2016-03-21 MED ORDER — NEOSTIGMINE METHYLSULFATE 10 MG/10ML IV SOLN
INTRAVENOUS | Status: AC
  Filled 2016-03-21: qty 1

## 2016-03-21 MED ORDER — CIPROFLOXACIN IN D5W 400 MG/200ML IV SOLN
INTRAVENOUS | Status: AC
  Filled 2016-03-21: qty 200

## 2016-03-21 MED ORDER — SCOPOLAMINE 1 MG/3DAYS TD PT72
MEDICATED_PATCH | TRANSDERMAL | Status: AC
  Filled 2016-03-21: qty 1

## 2016-03-21 MED ORDER — GLYCOPYRROLATE 0.2 MG/ML IJ SOLN
INTRAMUSCULAR | Status: DC | PRN
  Administered 2016-03-21: 0.2 mg via INTRAVENOUS

## 2016-03-21 MED ORDER — ONDANSETRON HCL 4 MG/2ML IJ SOLN
INTRAMUSCULAR | Status: AC
  Filled 2016-03-21: qty 2

## 2016-03-21 MED ORDER — GLYCOPYRROLATE 0.2 MG/ML IJ SOLN
INTRAMUSCULAR | Status: AC
  Filled 2016-03-21: qty 1

## 2016-03-21 MED ORDER — FENTANYL CITRATE (PF) 100 MCG/2ML IJ SOLN
INTRAMUSCULAR | Status: DC | PRN
  Administered 2016-03-21: 75 ug via INTRAVENOUS
  Administered 2016-03-21: 25 ug via INTRAVENOUS

## 2016-03-21 MED ORDER — LIDOCAINE HCL (CARDIAC) 20 MG/ML IV SOLN
INTRAVENOUS | Status: AC
  Filled 2016-03-21: qty 5

## 2016-03-21 MED ORDER — ROCURONIUM BROMIDE 50 MG/5ML IV SOLN
INTRAVENOUS | Status: AC
  Filled 2016-03-21: qty 1

## 2016-03-21 MED ORDER — DEXAMETHASONE SODIUM PHOSPHATE 4 MG/ML IJ SOLN
INTRAMUSCULAR | Status: DC | PRN
  Administered 2016-03-21: 10 mg via INTRAVENOUS

## 2016-03-21 MED ORDER — KETOROLAC TROMETHAMINE 30 MG/ML IJ SOLN
INTRAMUSCULAR | Status: DC | PRN
  Administered 2016-03-21: 15 mg via INTRAVENOUS

## 2016-03-21 SURGICAL SUPPLY — 54 items
APPLIER CLIP ROT 10 11.4 M/L (STAPLE)
APR CLP MED LRG 11.4X10 (STAPLE)
BAG SPEC RTRVL 10 TROC 200 (ENDOMECHANICALS) ×1
BLADE SURG 11 STRL SS (BLADE) ×3 IMPLANT
CHLORAPREP W/TINT 26ML (MISCELLANEOUS) ×3 IMPLANT
CLIP APPLIE ROT 10 11.4 M/L (STAPLE) IMPLANT
CLIP LIGATING HEM O LOK PURPLE (MISCELLANEOUS) ×3 IMPLANT
CLIP LIGATING HEMO LOK XL GOLD (MISCELLANEOUS) IMPLANT
COVER BACK TABLE 60X90IN (DRAPES) ×3 IMPLANT
COVER MAYO STAND STRL (DRAPES) ×3 IMPLANT
CUTTER FLEX LINEAR 45M (STAPLE) IMPLANT
DECANTER SPIKE VIAL GLASS SM (MISCELLANEOUS) ×3 IMPLANT
DEVICE PMI PUNCTURE CLOSURE (MISCELLANEOUS) ×2 IMPLANT
DRAIN CHANNEL 19F RND (DRAIN) IMPLANT
DRAPE C-ARM 42X72 X-RAY (DRAPES) IMPLANT
DRAPE LAPAROSCOPIC ABDOMINAL (DRAPES) ×3 IMPLANT
DRAPE UTILITY XL STRL (DRAPES) ×3 IMPLANT
ELECT REM PT RETURN 9FT ADLT (ELECTROSURGICAL) ×3
ELECTRODE REM PT RTRN 9FT ADLT (ELECTROSURGICAL) ×1 IMPLANT
EVACUATOR SILICONE 100CC (DRAIN) IMPLANT
GLOVE BIO SURGEON STRL SZ 6 (GLOVE) ×2 IMPLANT
GLOVE BIOGEL PI IND STRL 7.0 (GLOVE) ×1 IMPLANT
GLOVE BIOGEL PI INDICATOR 7.0 (GLOVE) ×2
GLOVE INDICATOR 6.5 STRL GRN (GLOVE) ×2 IMPLANT
GLOVE INDICATOR 7.5 STRL GRN (GLOVE) ×6 IMPLANT
GLOVE SURG SS PI 7.0 STRL IVOR (GLOVE) ×3 IMPLANT
GOWN STRL REUS W/TWL LRG LVL3 (GOWN DISPOSABLE) ×5 IMPLANT
IV NS 1000ML (IV SOLUTION) ×3
IV NS 1000ML BAXH (IV SOLUTION) ×1 IMPLANT
LIQUID BAND (GAUZE/BANDAGES/DRESSINGS) ×3 IMPLANT
NEEDLE HYPO 22GX1.5 SAFETY (NEEDLE) ×3 IMPLANT
PACK BASIN DAY SURGERY FS (CUSTOM PROCEDURE TRAY) ×3 IMPLANT
POUCH RETRIEVAL ECOSAC 10 (ENDOMECHANICALS) ×1 IMPLANT
POUCH RETRIEVAL ECOSAC 10MM (ENDOMECHANICALS) ×2
RELOAD 45 VASCULAR/THIN (ENDOMECHANICALS) IMPLANT
RELOAD STAPLE 45 2.5 WHT GRN (ENDOMECHANICALS) IMPLANT
RELOAD STAPLE 45 3.5 BLU ETS (ENDOMECHANICALS) IMPLANT
RELOAD STAPLE TA45 3.5 REG BLU (ENDOMECHANICALS) IMPLANT
SCISSORS LAP 5X35 DISP (ENDOMECHANICALS) IMPLANT
SET IRRIG TUBING LAPAROSCOPIC (IRRIGATION / IRRIGATOR) ×2 IMPLANT
SHEARS HARMONIC ACE PLUS 36CM (ENDOMECHANICALS) IMPLANT
SOLUTION ANTI FOG 6CC (MISCELLANEOUS) ×3 IMPLANT
STOPCOCK 4 WAY LG BORE MALE ST (IV SETS) IMPLANT
SUT ETHILON 2 0 PS N (SUTURE) IMPLANT
SUT MNCRL AB 4-0 PS2 18 (SUTURE) ×3 IMPLANT
SUT VICRYL 0 ENDOLOOP (SUTURE) IMPLANT
SUT VICRYL 0 UR6 27IN ABS (SUTURE) ×3 IMPLANT
SYR 20CC LL (SYRINGE) ×2 IMPLANT
SYR CONTROL 10ML LL (SYRINGE) ×3 IMPLANT
TOWEL OR 17X24 6PK STRL BLUE (TOWEL DISPOSABLE) ×6 IMPLANT
TROCAR BLADELESS OPT 5 100 (ENDOMECHANICALS) ×3 IMPLANT
TROCAR XCEL 12X100 BLDLESS (ENDOMECHANICALS) ×3 IMPLANT
TROCAR Z-THREAD FIOS 5X100MM (TROCAR) ×6 IMPLANT
TUBING INSUF HEATED (TUBING) ×3 IMPLANT

## 2016-03-21 NOTE — Transfer of Care (Signed)
Immediate Anesthesia Transfer of Care Note  Patient: April Holt  Procedure(s) Performed: Procedure(s): LAPAROSCOPIC CHOLECYSTECTOMY (N/A)  Patient Location: PACU  Anesthesia Type:General  Level of Consciousness: awake and oriented  Airway & Oxygen Therapy: Patient Spontanous Breathing and Patient connected to nasal cannula oxygen  Post-op Assessment: Report given to RN  Post vital signs: Reviewed and stable  Last Vitals: 115/60, 65, 12, 100%, t 97.6 Filed Vitals:   03/21/16 0838  BP: 123/69  Pulse: 85  Temp: 36.7 C  Resp: 16    Last Pain: There were no vitals filed for this visit.    Patients Stated Pain Goal: 8 [id number]  Complications: No apparent anesthesia complications

## 2016-03-21 NOTE — Anesthesia Postprocedure Evaluation (Signed)
Anesthesia Post Note  Patient: April Holt  Procedure(s) Performed: Procedure(s) (LRB): LAPAROSCOPIC CHOLECYSTECTOMY (N/A)  Patient location during evaluation: PACU Anesthesia Type: General Level of consciousness: sedated Pain management: pain level controlled Vital Signs Assessment: post-procedure vital signs reviewed and stable Respiratory status: spontaneous breathing and respiratory function stable Cardiovascular status: stable Anesthetic complications: no    Last Vitals:  Filed Vitals:   03/21/16 1130 03/21/16 1145  BP: 116/60 112/59  Pulse: 61 55  Temp:    Resp: 15 14    Last Pain:  Filed Vitals:   03/21/16 1146  PainSc: 0-No pain                 Raidon Swanner DANIEL

## 2016-03-21 NOTE — Op Note (Signed)
Preoperative diagnosis: chronic cholecystitis  Postoperative diagnosis: Same   Procedure: laparoscopic cholecystectomy  Surgeon: Gurney Maxin, M.D.  Asst: none  Anesthesia: Gen.   Indications for procedure: April Holt is a 75 y.o. female with symptoms of Abdominal pain, RUQ pain and Nausea consistent with gallbladder disease, Confirmed by Ultrasound.  Description of procedure: The patient was brought into the operative suite, placed supine. Anesthesia was administered with endotracheal tube. Patient was strapped in place and foot board was secured. All pressure points were offloaded by foam padding. The patient was prepped and draped in the usual sterile fashion.  A small incision was made to the right of the umbilicus. A 28mm trocar was inserted into the peritoneal cavity with optical entry. Pneumoperitoneum was applied with high flow low pressure. 2 65mm trocars were placed in the RUQ. A 76mm trocar was placed in the subxiphoid space. All trocars sites were first anesthesized with 0.25% marcaine with epinephrine in the subcutaneous and preperitoneal layers. Next the patient was placed in reverse trendelenberg. The gallbladder was elongated and white in color.  The gallbladder was retracted cephalad and lateral. The peritoneum was reflected off the infundibulum working lateral to medial. The cystic duct and cystic artery were identified and further dissection revealed a critical view. The cystic duct and cystic artery were doubly clipped and ligated.   The gallbladder was removed off the liver bed with cautery. The Gallbladder was placed in a specimen bag. The gallbladder fossa was irrigated and hemostasis was applied with cautery. The gallbladder was removed via the 54mm trocar. 1 0 vicryl suture was used to close the fascial defect. Pneumoperitoneum was removed, all trocar were removed. All incisions were closed with 4-0 monocryl subcuticular stitch. The patient woke from anesthesia and was  brought to PACU in stable condition.  Findings: elongated gallbladder with multiple stones  Specimen: gallbldadder  Blood loss: 30ml  Local anesthesia: 73ml 0.25% marcaine w epi  Complications: none  Gurney Maxin, M.D. General, Bariatric, & Minimally Invasive Surgery [facility]

## 2016-03-21 NOTE — H&P (Signed)
History of Present Illness April Spruce MD; 02/09/2016 11:44 AM) The patient is a 75 year old female who presents for evaluation of gall stones. Referred by Maurice Small. She has a 2 year history of vague abdominal pains in the epigastrium. 2 weeks ago she had an acute at a restaurant where she felt clammy, had discomfort throughout her abdomen and back, had nausea, later had a loose bowel movement. The symptoms slowly improved over 3 hours. Since then she has felt nausea and vague pains in her upper abdomen and shoulders. She denies nausea and vomiting. She has had more erructations than previous to this event as well.   Other Problems April Holt, CMA; 02/09/2016 11:08 AM) Anxiety Disorder Back Pain Cholelithiasis Heart murmur  Past Surgical History April Holt, CMA; 02/09/2016 11:08 AM) Colon Polyp Removal - Colonoscopy Hysterectomy (not due to cancer) - Partial Spinal Surgery - Lower Back Tonsillectomy  Diagnostic Studies History April Holt, CMA; 02/09/2016 11:08 AM) Colonoscopy 1-5 years ago Mammogram within last year  Allergies April Holt, CMA; 02/09/2016 11:09 AM) PenicillAMINE *ASSORTED CLASSES* Sulfa Antibiotics  Medication History (April Holt, CMA; 02/09/2016 11:10 AM) Latanoprost (0.005% Solution, Ophthalmic) Active. Premarin (0.625MG /GM Cream, Vaginal) Active. Primidone (250MG  Tablet, Oral) Active. Vagifem (10MCG Tablet, Vaginal) Active. Medications Reconciled  Social History April Holt, CMA; 02/09/2016 11:44 AM) Alcohol use Occasional alcohol use. Caffeine use Coffee, Tea. No drug use Tobacco use Never smoker.  Family History April Holt, CMA; 02/09/2016 11:08 AM) Arthritis Father, Mother, Sister. Breast Cancer Sister. Cerebrovascular Accident Mother. Heart Disease Father. Hypertension Father. Thyroid problems Mother.  Pregnancy / Birth History April Holt, CMA; 02/09/2016 11:44 AM) Age at menarche 55 years. Age of menopause  >62 Contraceptive History Oral contraceptives. Gravida 2 Irregular periods Maternal age 4-30 Para 2    Review of Systems April Holt CMA; 02/09/2016 11:44 AM) General Not Present- Appetite Loss, Chills, Fatigue, Fever, Night Sweats, Weight Gain and Weight Loss. Skin Not Present- Change in Wart/Mole, Dryness, Hives, Jaundice, New Lesions, Non-Healing Wounds, Rash and Ulcer. HEENT Present- Seasonal Allergies and Wears glasses/contact lenses. Not Present- Earache, Hearing Loss, Hoarseness, Nose Bleed, Oral Ulcers, Ringing in the Ears, Sinus Pain, Sore Throat, Visual Disturbances and Yellow Eyes. Respiratory Not Present- Bloody sputum, Chronic Cough, Difficulty Breathing, Snoring and Wheezing. Breast Not Present- Breast Mass, Breast Pain, Nipple Discharge and Skin Changes. Cardiovascular Present- Leg Cramps. Not Present- Chest Pain, Difficulty Breathing Lying Down, Palpitations, Rapid Heart Rate, Shortness of Breath and Swelling of Extremities. Gastrointestinal Present- Nausea. Not Present- Abdominal Pain, Bloating, Bloody Stool, Change in Bowel Habits, Chronic diarrhea, Constipation, Difficulty Swallowing, Excessive gas, Gets full quickly at meals, Hemorrhoids, Indigestion, Rectal Pain and Vomiting. Female Genitourinary Not Present- Frequency, Nocturia, Painful Urination, Pelvic Pain and Urgency. Musculoskeletal Present- Back Pain and Joint Pain. Not Present- Joint Stiffness, Muscle Pain, Muscle Weakness and Swelling of Extremities. Neurological Present- Tremor. Not Present- Decreased Memory, Fainting, Headaches, Numbness, Seizures, Tingling, Trouble walking and Weakness. Psychiatric Not Present- Anxiety, Bipolar, Change in Sleep Pattern, Depression, Fearful and Frequent crying. Endocrine Not Present- Cold Intolerance, Excessive Hunger, Hair Changes, Heat Intolerance, Hot flashes and New Diabetes. Hematology Not Present- Easy Bruising, Excessive bleeding, Gland problems, HIV and Persistent  Infections.  Vitals (April Holt CMA; 02/09/2016 11:09 AM) 02/09/2016 11:08 AM Weight: 122 lb Height: 65in Body Surface Area: 1.6 m Body Mass Index: 20.3 kg/m  Temp.: 97.49F(Temporal)  Pulse: 79 (Regular)  BP: 122/80 (Sitting, Left Arm, Standard)       Physical Exam (April Spruce, MD;  02/09/2016 11:46 AM) General Mental Status-Alert. General Appearance-Cooperative. Orientation-Oriented X4. Posture-Normal posture.  Integumentary Global Assessment Normal Exam - Head/Face: no rashes, ulcers, lesions or evidence of photo damage. No palpable nodules or masses and Neck: no visible lesions or palpable masses.  Head and Neck Head-normocephalic, atraumatic with no lesions or palpable masses. Face Global Assessment - atraumatic. Thyroid Gland Characteristics - normal size and consistency.  Eye Eyeball - Bilateral-Extraocular movements intact. Sclera/Conjunctiva - Bilateral-No scleral icterus, No Discharge.  ENMT Nose and Sinuses Nose - no deformities observed, no swelling present.  Chest and Lung Exam Palpation Normal exam - Non-tender. Auscultation Breath sounds - Normal.  Cardiovascular Auscultation Rhythm - Regular. Heart Sounds - S1 WNL and S2 WNL. Carotid arteries - No Carotid bruit.  Abdomen Inspection Normal Exam - No Visible peristalsis, No Abnormal pulsations and No Paradoxical movements. Palpation/Percussion Normal exam - Soft, No Rebound tenderness, No Rigidity (guarding), No hepatosplenomegaly and No Palpable abdominal masses. Tenderness - Right Upper Quadrant. Gallbladder - Negative Murphy's sign.  Peripheral Vascular Upper Extremity Palpation - Pulses bilaterally normal. Lower Extremity Palpation - Edema - Bilateral - No edema.  Neurologic Neurologic evaluation reveals -normal sensation and normal coordination.  Neuropsychiatric Mental status exam performed with findings of-able to articulate well with normal  speech/language, rate, volume and coherence and thought content normal with ability to perform basic computations and apply abstract reasoning.  Musculoskeletal Normal Exam - Bilateral-Upper Extremity Strength Normal and Lower Extremity Strength Normal.    Assessment & Plan April Spruce MD; 02/09/2016 11:46 AM) GALL BLADDER STONES (K80.20) Impression: 75 yo female with long history of vague abdominal pain, findings of gallstones on [place] as well as being present for several years. We discussed the etiology of gallstones and probably cause pain. We discussed exacerbating factors including fatty meals. We discussed the details of surgery for removal of her gallbladder including general anesthesia, 4 small incisions in her abdomen, removal of her gallbladder with the liver and common bile duct, and most likely outpatient procedure. We discussed risks of common bile duct injury, cystic duct stump leak, injury to liver, bleeding, infection, need for open procedure, and this cholecystectomy syndrome. She showed good understanding and wanted to proceed with laparoscopic cholecystectomy. Current Plans The anatomy & physiology of hepatobiliary & pancreatic function was discussed. The pathophysiology of gallbladder dysfunction was discussed. Natural history risks without surgery was discussed. I feel the risks of no intervention will lead to serious problems that outweigh the operative risks; therefore, I recommended cholecystectomy to remove the pathology. I explained laparoscopic techniques with possible need for an open approach. Probable cholangiogram to evaluate the bilary tract was explained as well.  Risks such as bleeding, infection, abscess, leak, injury to other organs, need for further treatment, heart attack, death, and other risks were discussed. I noted a good likelihood this will help address the problem. Possibility that this will not correct all abdominal symptoms was explained. Goals of  post-operative recovery were discussed as well. We will work to minimize complications. An educational handout further explaining the pathology and treatment options was given as well. Questions were answered. The patient expresses understanding & wishes to proceed with surgery.  Pt Education - Laparoscopic Cholecystectomy: gallbladder Pt Education - Pamphlet Given - Laparoscopic Gallbladder Surgery: discussed with patient and provided information. You are being scheduled for surgery - Our schedulers will call you.  You should hear from our office's scheduling department within 5 working days about the location, date, and time of surgery. We try  to make accommodations for patient's preferences in scheduling surgery, but sometimes the OR schedule or the surgeon's schedule prevents [place] from making those accommodations.  If you have not heard from our office [phone]) in 5 working days, call the office and ask for your surgeon's nurse.  If you have other questions about your diagnosis, plan, or surgery, call the office and ask for your surgeon's nurse.  GASTROESOPHAGEAL REFLUX (K21.9) Impression: We did discuss that some of her symptoms could be reflux rather than biliary disease and at this time will trial an antacid. We did discuss long term issues with this medication and that it is only for symptom control so if it does not improve symptoms to stop it in 1-2 weeks. Current Plans Started Protonix 40MG , 1 (one) Tablet daily, #90, 90 days starting 02/09/2016, No Refill.

## 2016-03-21 NOTE — Discharge Instructions (Signed)

## 2016-03-21 NOTE — Anesthesia Procedure Notes (Signed)
Procedure Name: Intubation Date/Time: 03/21/2016 10:14 AM Performed by: Bethena Roys T Pre-anesthesia Checklist: Patient identified, Emergency Drugs available, Suction available and Patient being monitored Patient Re-evaluated:Patient Re-evaluated prior to inductionOxygen Delivery Method: Circle System Utilized Preoxygenation: Pre-oxygenation with 100% oxygen Intubation Type: IV induction Ventilation: Mask ventilation without difficulty Laryngoscope Size: Mac and 3 Grade View: Grade I Tube type: Oral Tube size: 7.0 mm Number of attempts: 1 Airway Equipment and Method: Stylet Placement Confirmation: ETT inserted through vocal cords under direct vision,  positive ETCO2 and breath sounds checked- equal and bilateral Secured at: 20 cm Tube secured with: Tape Dental Injury: Teeth and Oropharynx as per pre-operative assessment

## 2016-03-21 NOTE — Interval H&P Note (Signed)
History and Physical Interval Note:  03/21/2016 9:39 AM  April Holt  has presented today for surgery, with the diagnosis of Chronic cholecystitis  The various methods of treatment have been discussed with the patient and family. After consideration of risks, benefits and other options for treatment, the patient has consented to  Procedure(s): LAPAROSCOPIC CHOLECYSTECTOMY (N/A) as a surgical intervention .  The patient's history has been reviewed, patient examined, no change in status, stable for surgery.  I have reviewed the patient's chart and labs.  Questions were answered to the patient's satisfaction.     Arta Bruce Dahlton Hinde

## 2016-03-21 NOTE — Anesthesia Preprocedure Evaluation (Addendum)
Anesthesia Evaluation  Patient identified by MRN, date of birth, ID band Patient awake    Reviewed: Allergy & Precautions, NPO status , Patient's Chart, lab work & pertinent test results  History of Anesthesia Complications (+) PONVNegative for: history of anesthetic complications  Airway Mallampati: II  TM Distance: >3 FB Neck ROM: Full    Dental  (+) Teeth Intact, Dental Advisory Given   Pulmonary neg pulmonary ROS,    Pulmonary exam normal        Cardiovascular negative cardio ROS Normal cardiovascular exam     Neuro/Psych negative neurological ROS  negative psych ROS   GI/Hepatic Neg liver ROS,   Endo/Other  negative endocrine ROS  Renal/GU negative Renal ROS     Musculoskeletal   Abdominal   Peds  Hematology   Anesthesia Other Findings   Reproductive/Obstetrics                            Anesthesia Physical Anesthesia Plan  ASA: II  Anesthesia Plan: General   Post-op Pain Management:    Induction: Intravenous  Airway Management Planned: Oral ETT  Additional Equipment:   Intra-op Plan:   Post-operative Plan: Extubation in OR  Informed Consent: I have reviewed the patients History and Physical, chart, labs and discussed the procedure including the risks, benefits and alternatives for the proposed anesthesia with the patient or authorized representative who has indicated his/her understanding and acceptance.   Dental advisory given  Plan Discussed with: Anesthesiologist and CRNA  Anesthesia Plan Comments:        Anesthesia Quick Evaluation

## 2016-03-22 ENCOUNTER — Encounter (HOSPITAL_BASED_OUTPATIENT_CLINIC_OR_DEPARTMENT_OTHER): Payer: Self-pay | Admitting: General Surgery

## 2016-06-08 ENCOUNTER — Encounter (HOSPITAL_BASED_OUTPATIENT_CLINIC_OR_DEPARTMENT_OTHER): Payer: Self-pay

## 2016-06-12 ENCOUNTER — Other Ambulatory Visit (HOSPITAL_COMMUNITY): Payer: Self-pay | Admitting: General Surgery

## 2016-06-12 DIAGNOSIS — Z9049 Acquired absence of other specified parts of digestive tract: Secondary | ICD-10-CM

## 2016-06-19 ENCOUNTER — Ambulatory Visit (HOSPITAL_COMMUNITY): Payer: Medicare Other

## 2016-06-19 ENCOUNTER — Ambulatory Visit (HOSPITAL_COMMUNITY)
Admission: RE | Admit: 2016-06-19 | Discharge: 2016-06-19 | Disposition: A | Discharge status: Home / Self Care | Payer: Medicare Other | Source: Ambulatory Visit | Attending: General Surgery | Admitting: General Surgery

## 2016-06-19 ENCOUNTER — Other Ambulatory Visit (HOSPITAL_COMMUNITY): Payer: Self-pay | Admitting: General Surgery

## 2016-06-19 DIAGNOSIS — K8050 Calculus of bile duct without cholangitis or cholecystitis without obstruction: Secondary | ICD-10-CM | POA: Diagnosis not present

## 2016-06-19 DIAGNOSIS — K838 Other specified diseases of biliary tract: Secondary | ICD-10-CM | POA: Insufficient documentation

## 2016-06-19 DIAGNOSIS — Z9049 Acquired absence of other specified parts of digestive tract: Secondary | ICD-10-CM | POA: Diagnosis not present

## 2016-06-21 ENCOUNTER — Encounter: Payer: Self-pay | Admitting: Internal Medicine

## 2016-07-02 ENCOUNTER — Encounter: Payer: Self-pay | Admitting: Internal Medicine

## 2016-07-02 ENCOUNTER — Encounter (INDEPENDENT_AMBULATORY_CARE_PROVIDER_SITE_OTHER): Payer: Self-pay

## 2016-07-02 ENCOUNTER — Ambulatory Visit (INDEPENDENT_AMBULATORY_CARE_PROVIDER_SITE_OTHER): Payer: Medicare Other | Admitting: Internal Medicine

## 2016-07-02 VITALS — BP 100/60 | HR 80 | Ht 65.0 in | Wt 117.4 lb

## 2016-07-02 DIAGNOSIS — K8051 Calculus of bile duct without cholangitis or cholecystitis with obstruction: Secondary | ICD-10-CM | POA: Diagnosis not present

## 2016-07-02 NOTE — Patient Instructions (Signed)
  You have been scheduled for an endoscopy. Please follow written instructions given to you at your visit today. If you use inhalers (even only as needed), please bring them with you on the day of your procedure.   The hospital will be contacting you.     I appreciate the opportunity to care for you. Silvano Rusk, MD, [facility]

## 2016-07-02 NOTE — Progress Notes (Deleted)
   Subjective:    Patient ID: April Holt, female    DOB: [birth year], 75 y.o.   MRN: [id number]  HPI    Review of Systems     Objective:   Physical Exam @BP  100/60 (BP Location: Left Arm, Patient Position: Sitting, Cuff Size: Normal)   Pulse 80   Ht 5\' 5"  (1.651 m)   Wt 117 lb 6.4 oz (53.3 kg)   BMI 19.54 kg/m @  General:  Well-developed, well-nourished and in no acute distress Eyes:  anicteric. ENT:   Mouth and posterior pharynx free of lesions.  Neck:   supple w/o thyromegaly or mass.  Lungs: Clear to auscultation bilaterally. Heart:  S1S2, no rubs, murmurs, gallops. Abdomen:  soft, non-tender, no hepatosplenomegaly, hernia, or mass and BS+.  Lymph:  no cervical or supraclavicular adenopathy. Extremities:   no edema, cyanosis or clubbing Skin   no rash. Neuro:  A&O x 3.  Psych:  appropriate mood and  Affect.   Data Reviewed:        Assessment & Plan:   Encounter Diagnosis  Name Primary?  . Calculus of bile duct with obstruction and without cholangitis or cholecystitis Yes

## 2016-07-02 NOTE — Progress Notes (Signed)
Referred by Dr. Kieth Brightly  Subjective:    Patient ID: April Holt, female    DOB: 11/05/1941, 75 y.o.   MRN: [id number] Cc; bile duct stones  HPI Is a very nice elderly white woman had a laparoscopic cholecystectomy for symptomatic cholelithiasis in May. No intraoperative cholangiogram was performed. She had a normal caliber bile duct at that time. Subsequent she was okay and then she started experiencing intermittent epigastric pains and lower chest pains radiated into the interscapular area. MRCP has shown 2 bile duct stones and a 12 mm maximal dilation of common bile duct. Images reviewed with the patient. She has spells that last for 15 minutes to one and a half hours of these pains. There postprandial. She has not taken any pain medication she is a sort of walks around and take some Tums and the symptoms eventually pass. She has not had significant persistent illness associated with this though she's had a couple of low-grade fevers in the past couple weeks but not necessarily associated with this pain. GI review of systems is otherwise negative. He has not noted any jaundice or itching that I am aware of.  Allergies  Allergen Reactions  . Adhesive [Tape] Other (See Comments)    "red blotches"  . Penicillins Other (See Comments)    Dizzy and sweaty  . Sulfa Antibiotics Nausea And Vomiting   Outpatient Medications Prior to Visit  Medication Sig Dispense Refill  . Calcium Carb-Cholecalciferol (CALCIUM 1000 + D PO) Take 1 tablet by mouth 2 (two) times daily.    . Flaxseed, Linseed, (FLAX SEED OIL) 1000 MG CAPS Take 1 capsule by mouth daily.    Marland Kitchen glucosamine-chondroitin 500-400 MG tablet Take 2 tablets by mouth daily.    Marland Kitchen latanoprost (XALATAN) 0.005 % ophthalmic solution Place 1 drop into both eyes at bedtime.     . primidone (MYSOLINE) 250 MG tablet TAKE 1/2 TO 1 TABLET DAILY AS NEEDED. (Patient taking differently: Take 125 mg by mouth every morning. TAKE 1/2 TO 1 TABLET DAILY AS  NEEDED.) 90 tablet 3  . HYDROcodone-acetaminophen (NORCO/VICODIN) 5-325 MG tablet Take 1 tablet by mouth every 6 (six) hours as needed for moderate pain. 30 tablet 0   No facility-administered medications prior to visit.    Past Medical History:  Diagnosis Date  . Arthritis    fingers, left knee  . Biliary colic   . Cholelithiasis   . Essential tremor    mostly head  . Glaucoma of both eyes   . Skin cancer, basal cell   . Wears glasses    Past Surgical History:  Procedure Laterality Date  . CHOLECYSTECTOMY N/A 03/21/2016   Procedure: LAPAROSCOPIC CHOLECYSTECTOMY;  Surgeon: Mickeal Skinner, MD;  Location: [facility];  Service: General;  Laterality: N/A;  . LUMBAR SPINE SURGERY  1983   L5-S1  . TONSILLECTOMY AND ADENOIDECTOMY  age 11  . TUBAL LIGATION  1990's  . VAGINAL HYSTERECTOMY  1993  . WRIST GANGLION EXCISION Left yrs ago   Social History   Social History  . Marital status: Married    Spouse name: N/A  . Number of children: N/A  . Years of education: N/A   Occupational History  . retired    Social History Main Topics  . Smoking status: Never Smoker  . Smokeless tobacco: Never Used  . Alcohol use 4.8 oz/week    4 Standard drinks or equivalent, 4 Glasses of wine per week     Comment: 1/2 glass  wine 4 times week  . Drug use: No  . Sexual activity: Not Asked   Other Topics Concern  . None   Social History Narrative   Pt lives at home with her husband of 23 years, Magie Mahle; they have 6 children. 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   Family History  Problem Relation Age of Onset  . Heart failure Father   . Stroke Mother   . Asthma Sister   . Breast cancer Sister     Review of Systems As per history of present illness. All other review of systems are negative at this time.    Objective:   Physical Exam @BP  100/60 (BP Location: Left Arm, Patient Position: Sitting, Cuff Size: Normal)   Pulse 80   Ht 5\' 5"  (1.651 m)   Wt  117 lb 6.4 oz (53.3 kg)   BMI 19.54 kg/m @  General:  Well-developed, well-nourished and in no acute distress Eyes:  anicteric. ENT:   Mouth and posterior pharynx free of lesions.  Neck:   supple w/o thyromegaly or mass.  Lungs: Clear to auscultation bilaterally. Heart:  S1S2, no rubs, murmurs, gallops. Abdomen:  soft, non-tender, no hepatosplenomegaly, hernia, or mass and BS+.  Lymph:  no cervical or supraclavicular adenopathy. Extremities:   no edema, cyanosis or clubbing Skin   no rash. Neuro:  A&O x 3.  Psych:  appropriate mood and  Affect.   Data Reviewed: As per history of present illness. I reviewed her operative note from cholecystectomy and the MRI MRCP.    Assessment & Plan:   1. Calculus of bile duct with obstruction and without cholangitis or cholecystitis     ERCP with common bile duct stone extraction is appropriate. I have explained the procedure in detail including sphincterotomy, balloon extraction of stones and the risks of bleeding infection reactions to medication perforation and pancreatitis and unforeseen complications. This was scheduled for Thursday, August 31 with Dr. Ardis Hughs my partner as his availability allowed for timeliest scheuling. She is aware he will be performing the procedure. I explained that unremitting pain, fever and jaundice are indications to seek help or go to ED sooner than time of ERCP.   I appreciate the opportunity to care for this patient. Cc: Dr. Gurney Maxin and Justin Mend, Valla Leaver, MD

## 2016-07-03 ENCOUNTER — Encounter (HOSPITAL_COMMUNITY): Payer: Self-pay | Admitting: *Deleted

## 2016-07-05 ENCOUNTER — Ambulatory Visit (HOSPITAL_COMMUNITY): Payer: Medicare Other | Admitting: Anesthesiology

## 2016-07-05 ENCOUNTER — Encounter (HOSPITAL_COMMUNITY): Payer: Self-pay

## 2016-07-05 ENCOUNTER — Encounter (HOSPITAL_COMMUNITY)
Admission: RE | Disposition: A | Discharge status: Home / Self Care | Payer: Self-pay | Source: Ambulatory Visit | Attending: Gastroenterology

## 2016-07-05 ENCOUNTER — Ambulatory Visit (HOSPITAL_COMMUNITY): Payer: Medicare Other

## 2016-07-05 ENCOUNTER — Ambulatory Visit (HOSPITAL_COMMUNITY)
Admission: RE | Admit: 2016-07-05 | Discharge: 2016-07-05 | Disposition: A | Discharge status: Home / Self Care | Payer: Medicare Other | Source: Ambulatory Visit | Attending: Gastroenterology | Admitting: Gastroenterology

## 2016-07-05 DIAGNOSIS — G250 Essential tremor: Secondary | ICD-10-CM | POA: Insufficient documentation

## 2016-07-05 DIAGNOSIS — Z9049 Acquired absence of other specified parts of digestive tract: Secondary | ICD-10-CM | POA: Insufficient documentation

## 2016-07-05 DIAGNOSIS — K8051 Calculus of bile duct without cholangitis or cholecystitis with obstruction: Secondary | ICD-10-CM

## 2016-07-05 DIAGNOSIS — Z85828 Personal history of other malignant neoplasm of skin: Secondary | ICD-10-CM | POA: Insufficient documentation

## 2016-07-05 DIAGNOSIS — Z79899 Other long term (current) drug therapy: Secondary | ICD-10-CM | POA: Insufficient documentation

## 2016-07-05 DIAGNOSIS — K839 Disease of biliary tract, unspecified: Secondary | ICD-10-CM | POA: Diagnosis present

## 2016-07-05 DIAGNOSIS — R932 Abnormal findings on diagnostic imaging of liver and biliary tract: Secondary | ICD-10-CM

## 2016-07-05 DIAGNOSIS — H409 Unspecified glaucoma: Secondary | ICD-10-CM | POA: Insufficient documentation

## 2016-07-05 DIAGNOSIS — K831 Obstruction of bile duct: Secondary | ICD-10-CM

## 2016-07-05 DIAGNOSIS — K8020 Calculus of gallbladder without cholecystitis without obstruction: Secondary | ICD-10-CM

## 2016-07-05 HISTORY — PX: ERCP: SHX5425

## 2016-07-05 SURGERY — ERCP, WITH INTERVENTION IF INDICATED
Anesthesia: General

## 2016-07-05 MED ORDER — DEXAMETHASONE SODIUM PHOSPHATE 10 MG/ML IJ SOLN
INTRAMUSCULAR | Status: AC
  Filled 2016-07-05: qty 1

## 2016-07-05 MED ORDER — PROPOFOL 10 MG/ML IV BOLUS
INTRAVENOUS | Status: AC
  Filled 2016-07-05: qty 40

## 2016-07-05 MED ORDER — CIPROFLOXACIN IN D5W 400 MG/200ML IV SOLN
400.0000 mg | Freq: Once | INTRAVENOUS | Status: AC
  Administered 2016-07-05: 400 mg via INTRAVENOUS

## 2016-07-05 MED ORDER — PROMETHAZINE HCL 25 MG/ML IJ SOLN: 6.2500 mg | INTRAMUSCULAR | Status: DC | PRN

## 2016-07-05 MED ORDER — ONDANSETRON HCL 4 MG/2ML IJ SOLN
INTRAMUSCULAR | Status: AC
  Filled 2016-07-05: qty 2

## 2016-07-05 MED ORDER — INDOMETHACIN 50 MG RE SUPP: 100.0000 mg | Freq: Once | RECTAL | Status: DC

## 2016-07-05 MED ORDER — PROPOFOL 10 MG/ML IV BOLUS
INTRAVENOUS | Status: DC | PRN
  Administered 2016-07-05: 120 mg via INTRAVENOUS
  Administered 2016-07-05: 30 mg via INTRAVENOUS
  Administered 2016-07-05: 20 mg via INTRAVENOUS
  Administered 2016-07-05: 30 mg via INTRAVENOUS

## 2016-07-05 MED ORDER — EPINEPHRINE HCL 0.1 MG/ML IJ SOSY
PREFILLED_SYRINGE | INTRAMUSCULAR | Status: AC
  Filled 2016-07-05: qty 10

## 2016-07-05 MED ORDER — LIDOCAINE HCL (CARDIAC) 20 MG/ML IV SOLN
INTRAVENOUS | Status: DC | PRN
  Administered 2016-07-05: 40 mg via INTRATRACHEAL

## 2016-07-05 MED ORDER — SODIUM CHLORIDE 0.9 % IV SOLN: INTRAVENOUS | Status: DC

## 2016-07-05 MED ORDER — FENTANYL CITRATE (PF) 100 MCG/2ML IJ SOLN
25.0000 ug | INTRAMUSCULAR | Status: DC | PRN
  Administered 2016-07-05 (×2): 50 ug via INTRAVENOUS

## 2016-07-05 MED ORDER — IOPAMIDOL (ISOVUE-300) INJECTION 61%
INTRAVENOUS | Status: DC | PRN
  Administered 2016-07-05: 25 mL via INTRAVENOUS

## 2016-07-05 MED ORDER — SUCCINYLCHOLINE CHLORIDE 20 MG/ML IJ SOLN
INTRAMUSCULAR | Status: DC | PRN
Start: 2016-07-05 — End: 2016-07-05
  Administered 2016-07-05: 100 mg via INTRAVENOUS

## 2016-07-05 MED ORDER — DEXAMETHASONE SODIUM PHOSPHATE 10 MG/ML IJ SOLN
INTRAMUSCULAR | Status: DC | PRN
  Administered 2016-07-05: 10 mg via INTRAVENOUS

## 2016-07-05 MED ORDER — ONDANSETRON HCL 4 MG/2ML IJ SOLN
INTRAMUSCULAR | Status: DC | PRN
  Administered 2016-07-05: 4 mg via INTRAVENOUS

## 2016-07-05 MED ORDER — GLUCAGON HCL RDNA (DIAGNOSTIC) 1 MG IJ SOLR
INTRAMUSCULAR | Status: DC | PRN
  Administered 2016-07-05: .5 mg via INTRAVENOUS

## 2016-07-05 MED ORDER — GLUCAGON HCL RDNA (DIAGNOSTIC) 1 MG IJ SOLR
INTRAMUSCULAR | Status: AC
  Filled 2016-07-05: qty 1

## 2016-07-05 MED ORDER — CIPROFLOXACIN IN D5W 400 MG/200ML IV SOLN
INTRAVENOUS | Status: AC
Start: 2016-07-05 — End: 2016-07-05
  Filled 2016-07-05: qty 200

## 2016-07-05 MED ORDER — SODIUM CHLORIDE 0.9 % IJ SOLN
INTRAMUSCULAR | Status: DC | PRN
  Administered 2016-07-05: 11:00:00
  Administered 2016-07-05: 10 mL

## 2016-07-05 MED ORDER — INDOMETHACIN 50 MG RE SUPP
RECTAL | Status: AC
  Filled 2016-07-05: qty 2

## 2016-07-05 MED ORDER — LACTATED RINGERS IV SOLN
INTRAVENOUS | Status: DC
  Administered 2016-07-05: 10:00:00 via INTRAVENOUS

## 2016-07-05 MED ORDER — LIDOCAINE 2% (20 MG/ML) 5 ML SYRINGE
INTRAMUSCULAR | Status: AC
  Filled 2016-07-05: qty 5

## 2016-07-05 MED ORDER — FENTANYL CITRATE (PF) 100 MCG/2ML IJ SOLN
INTRAMUSCULAR | Status: AC
  Filled 2016-07-05: qty 2

## 2016-07-05 MED ORDER — INDOMETHACIN 50 MG RE SUPP
RECTAL | Status: DC | PRN
  Administered 2016-07-05: 100 mg via RECTAL

## 2016-07-05 MED ORDER — SODIUM CHLORIDE 0.9 % IJ SOLN
INTRAMUSCULAR | Status: AC
  Filled 2016-07-05: qty 10

## 2016-07-05 MED ORDER — LIDOCAINE HCL (CARDIAC) 20 MG/ML IV SOLN
INTRAVENOUS | Status: DC | PRN
  Administered 2016-07-05: 40 mg via INTRAVENOUS

## 2016-07-05 NOTE — H&P (View-Only) (Signed)
Referred by Dr. Kieth Brightly  Subjective:    Patient ID: April Holt, female    DOB: 1940/11/12, 75 y.o.   MRN: [id number] Cc; bile duct stones  HPI Is a very nice elderly white woman had a laparoscopic cholecystectomy for symptomatic cholelithiasis in May. No intraoperative cholangiogram was performed. She had a normal caliber bile duct at that time. Subsequent she was okay and then she started experiencing intermittent epigastric pains and lower chest pains radiated into the interscapular area. MRCP has shown 2 bile duct stones and a 12 mm maximal dilation of common bile duct. Images reviewed with the patient. She has spells that last for 15 minutes to one and a half hours of these pains. There postprandial. She has not taken any pain medication she is a sort of walks around and take some Tums and the symptoms eventually pass. She has not had significant persistent illness associated with this though she's had a couple of low-grade fevers in the past couple weeks but not necessarily associated with this pain. GI review of systems is otherwise negative. He has not noted any jaundice or itching that I am aware of.  Allergies  Allergen Reactions  . Adhesive [Tape] Other (See Comments)    "red blotches"  . Penicillins Other (See Comments)    Dizzy and sweaty  . Sulfa Antibiotics Nausea And Vomiting   Outpatient Medications Prior to Visit  Medication Sig Dispense Refill  . Calcium Carb-Cholecalciferol (CALCIUM 1000 + D PO) Take 1 tablet by mouth 2 (two) times daily.    . Flaxseed, Linseed, (FLAX SEED OIL) 1000 MG CAPS Take 1 capsule by mouth daily.    Marland Kitchen glucosamine-chondroitin 500-400 MG tablet Take 2 tablets by mouth daily.    Marland Kitchen latanoprost (XALATAN) 0.005 % ophthalmic solution Place 1 drop into both eyes at bedtime.     . primidone (MYSOLINE) 250 MG tablet TAKE 1/2 TO 1 TABLET DAILY AS NEEDED. (Patient taking differently: Take 125 mg by mouth every morning. TAKE 1/2 TO 1 TABLET DAILY AS  NEEDED.) 90 tablet 3  . HYDROcodone-acetaminophen (NORCO/VICODIN) 5-325 MG tablet Take 1 tablet by mouth every 6 (six) hours as needed for moderate pain. 30 tablet 0   No facility-administered medications prior to visit.    Past Medical History:  Diagnosis Date  . Arthritis    fingers, left knee  . Biliary colic   . Cholelithiasis   . Essential tremor    mostly head  . Glaucoma of both eyes   . Skin cancer, basal cell   . Wears glasses    Past Surgical History:  Procedure Laterality Date  . CHOLECYSTECTOMY N/A 03/21/2016   Procedure: LAPAROSCOPIC CHOLECYSTECTOMY;  Surgeon: Mickeal Skinner, MD;  Location: [facility];  Service: General;  Laterality: N/A;  . LUMBAR SPINE SURGERY  1983   L5-S1  . TONSILLECTOMY AND ADENOIDECTOMY  age 72  . TUBAL LIGATION  1990's  . VAGINAL HYSTERECTOMY  1993  . WRIST GANGLION EXCISION Left yrs ago   Social History   Social History  . Marital status: Married    Spouse name: N/A  . Number of children: N/A  . Years of education: N/A   Occupational History  . retired    Social History Main Topics  . Smoking status: Never Smoker  . Smokeless tobacco: Never Used  . Alcohol use 4.8 oz/week    4 Standard drinks or equivalent, 4 Glasses of wine per week     Comment: 1/2 glass  wine 4 times week  . Drug use: No  . Sexual activity: Not Asked   Other Topics Concern  . None   Social History Narrative   Pt lives at home with her husband of 23 years, Isabella Chelton; they have 6 children. 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   Family History  Problem Relation Age of Onset  . Heart failure Father   . Stroke Mother   . Asthma Sister   . Breast cancer Sister     Review of Systems As per history of present illness. All other review of systems are negative at this time.    Objective:   Physical Exam @BP  100/60 (BP Location: Left Arm, Patient Position: Sitting, Cuff Size: Normal)   Pulse 80   Ht 5\' 5"  (1.651 m)   Wt  117 lb 6.4 oz (53.3 kg)   BMI 19.54 kg/m @  General:  Well-developed, well-nourished and in no acute distress Eyes:  anicteric. ENT:   Mouth and posterior pharynx free of lesions.  Neck:   supple w/o thyromegaly or mass.  Lungs: Clear to auscultation bilaterally. Heart:  S1S2, no rubs, murmurs, gallops. Abdomen:  soft, non-tender, no hepatosplenomegaly, hernia, or mass and BS+.  Lymph:  no cervical or supraclavicular adenopathy. Extremities:   no edema, cyanosis or clubbing Skin   no rash. Neuro:  A&O x 3.  Psych:  appropriate mood and  Affect.   Data Reviewed: As per history of present illness. I reviewed her operative note from cholecystectomy and the MRI MRCP.    Assessment & Plan:   1. Calculus of bile duct with obstruction and without cholangitis or cholecystitis     ERCP with common bile duct stone extraction is appropriate. I have explained the procedure in detail including sphincterotomy, balloon extraction of stones and the risks of bleeding infection reactions to medication perforation and pancreatitis and unforeseen complications. This was scheduled for Thursday, August 31 with Dr. Ardis Hughs my partner as his availability allowed for timeliest scheuling. She is aware he will be performing the procedure. I explained that unremitting pain, fever and jaundice are indications to seek help or go to ED sooner than time of ERCP.   I appreciate the opportunity to care for this patient. Cc: Dr. Gurney Maxin and Justin Mend, Valla Leaver, MD

## 2016-07-05 NOTE — Anesthesia Preprocedure Evaluation (Addendum)
Anesthesia Evaluation  Patient identified by MRN, date of birth, ID band Patient awake    Reviewed: Allergy & Precautions, NPO status , Patient's Chart, lab work & pertinent test results  Airway Mallampati: II  TM Distance: >3 FB Neck ROM: Full    Dental no notable dental hx.    Pulmonary neg pulmonary ROS,    Pulmonary exam normal breath sounds clear to auscultation       Cardiovascular negative cardio ROS Normal cardiovascular exam Rhythm:Regular Rate:Normal     Neuro/Psych negative neurological ROS  negative psych ROS   GI/Hepatic negative GI ROS, Neg liver ROS,   Endo/Other  negative endocrine ROS  Renal/GU negative Renal ROS  negative genitourinary   Musculoskeletal  (+) Arthritis ,   Abdominal   Peds negative pediatric ROS (+)  Hematology negative hematology ROS (+)   Anesthesia Other Findings   Reproductive/Obstetrics negative OB ROS                             Anesthesia Physical Anesthesia Plan  ASA: II  Anesthesia Plan: General   Post-op Pain Management:    Induction: Intravenous  Airway Management Planned: Oral ETT  Additional Equipment:   Intra-op Plan:   Post-operative Plan: Extubation in OR  Informed Consent: I have reviewed the patients History and Physical, chart, labs and discussed the procedure including the risks, benefits and alternatives for the proposed anesthesia with the patient or authorized representative who has indicated his/her understanding and acceptance.   Dental advisory given  Plan Discussed with: CRNA  Anesthesia Plan Comments:         Anesthesia Quick Evaluation

## 2016-07-05 NOTE — Op Note (Signed)
[facility] Patient Name: April Holt Procedure Date: 07/05/2016 MRN: [id number] Attending MD: Milus Banister , MD Date of Birth: 10/08/1941 CSN: [id number] Age: 75 Admit Type: Outpatient Procedure:                ERCP Indications:              Abnormal MRCP Providers:                Milus Banister, MD, Hilma Favors, RN, Alfonso Patten, Technician, Rosario Adie, CRNA Referring MD:             Silvano Rusk, MD Medicines:                General Anesthesia, Cipro 400 mg IV, Indomethacin                            [id number] mg PR Complications:            No immediate complications. Estimated blood loss:                            None Estimated Blood Loss:     Estimated blood loss: none. Procedure:                Pre-Anesthesia Assessment:                           - Prior to the procedure, a History and Physical                            was performed, and patient medications and                            allergies were reviewed. The patient's tolerance of                            previous anesthesia was also reviewed. The risks                            and benefits of the procedure and the sedation                            options and risks were discussed with the patient.                            All questions were answered, and informed consent                            was obtained. Prior Anticoagulants: The patient has                            taken no previous anticoagulant or antiplatelet                            agents.  ASA Grade Assessment: II - A patient with                            mild systemic disease. After reviewing the risks                            and benefits, the patient was deemed in                            satisfactory condition to undergo the procedure.                           After obtaining informed consent, the scope was                            passed under direct vision. Throughout the                           procedure, the patient's blood pressure, pulse, and                            oxygen saturations were monitored continuously. The                            [id number]) scope was introduced through                            the mouth, and used to inject contrast into and                            used to inject contrast into the bile duct. The                            ERCP was accomplished without difficulty. The                            patient tolerated the procedure well. Scope In: Scope Out: Findings:      There was a small periampullary diverticulum that did not interfere with       the procedure. A 44 Autotome over a .035 hydrawire was used to cannulate       the bile duct and contrast was injected. Cholangiogram showed diffusely       diltated extrahepatic bile ducts (CBD up to 62mm) with two clear, 7-56mm       mobile filling defects consistent with stones. No strictures were seen.       An adequate biliary sphincterotomy was performed over the wire without       any bleeding. The site was then dilated with 68mm biliary dilating       balloon as I felt I could not safely extend the sphincterotomy any       longer by cutting yet it was not large enough to remove the medium sized       stones. There was no bleeding after balloon dilation. The (brown) stones       were then swept from the  CBD using a sweeping balloon. There was no       purulence. The sphincterotomy site began to slowly ooze after the first       stone was removed. Occlusion, completion cholangiogram showed no       retained stones. I turned attention to the oozing sphincterotomy site       and flushed the site multiple times with saline and the oozing nearly       stopped. I then sprayed the raw surface of the sphincterotomy with       dilute epinephrine through a sclertherapy needled. This resulted in       complete hemostasis, observed for 2-3 minutes without rebleeding. The        main pancreatic duct was cannulated once with the wire but never       injected with dye. Impression:               - Choledocholithiasis was found. Complete removal                            was accomplished by biliary sphincterotomy, balloon                            dilation of the sphinctertomy site and then balloon                            extraction.                           - Oozing at the sphincterotomy site (after stone                            removal) was treated with saline flush and spraying                            of the site with dilute epinephrine. Moderate Sedation:      N/A- Per Anesthesia Care Recommendation:           - Discharge patient to home (ambulatory).                           - Follow clinically. Procedure Code(s):        --- Professional ---                           [phone], Endoscopic retrograde                            cholangiopancreatography (ERCP); with removal of                            calculi/debris from biliary/pancreatic duct(s)                           [id number], Endoscopic retrograde                            cholangiopancreatography (ERCP); with  sphincterotomy/papillotomy Diagnosis Code(s):        --- Professional ---                           K80.50, Calculus of bile duct without cholangitis                            or cholecystitis without obstruction                           R93.2, Abnormal findings on diagnostic imaging of                            liver and biliary tract CPT copyright 2016 [employer]. All rights reserved. The codes documented in this report are preliminary and upon coder review may  be revised to meet current compliance requirements. Milus Banister, MD 07/05/2016 11:36:14 AM This report has been signed electronically. Number of Addenda: 0

## 2016-07-05 NOTE — Transfer of Care (Signed)
Immediate Anesthesia Transfer of Care Note  Patient: April Holt  Procedure(s) Performed: Procedure(s): ENDOSCOPIC RETROGRADE CHOLANGIOPANCREATOGRAPHY (ERCP) (N/A)  Patient Location: PACU  Anesthesia Type:MAC  Level of Consciousness: awake, alert  and oriented  Airway & Oxygen Therapy: Patient Spontanous Breathing and Patient connected to face mask oxygen  Post-op Assessment: Report given to RN and Post -op Vital signs reviewed and stable  Post vital signs: Reviewed and stable  Last Vitals:  Vitals:   07/05/16 0933  BP: (!) 113/47  Pulse: 81  Resp: (!) 9  Temp: 36.8 C    Last Pain:  Vitals:   07/05/16 0933  TempSrc: Oral         Complications: No apparent anesthesia complications

## 2016-07-05 NOTE — Interval H&P Note (Signed)
History and Physical Interval Note:  07/05/2016 9:34 AM  April Holt  has presented today for surgery, with the diagnosis of blockage in common bile duct  The various methods of treatment have been discussed with the patient and family. After consideration of risks, benefits and other options for treatment, the patient has consented to  Procedure(s): ENDOSCOPIC RETROGRADE CHOLANGIOPANCREATOGRAPHY (ERCP) (N/A) as a surgical intervention .  The patient's history has been reviewed, patient examined, no change in status, stable for surgery.  I have reviewed the patient's chart and labs.  Questions were answered to the patient's satisfaction.     Milus Banister

## 2016-07-05 NOTE — Anesthesia Procedure Notes (Signed)
Procedure Name: Intubation Date/Time: 07/05/2016 10:33 AM Performed by: Glory Buff Pre-anesthesia Checklist: Patient identified, Emergency Drugs available, Suction available and Patient being monitored Patient Re-evaluated:Patient Re-evaluated prior to inductionOxygen Delivery Method: Circle system utilized Preoxygenation: Pre-oxygenation with 100% oxygen Intubation Type: IV induction Ventilation: Mask ventilation without difficulty Laryngoscope Size: Miller and 3 Grade View: Grade II Tube type: Oral Tube size: 7.0 mm Number of attempts: 1 Airway Equipment and Method: Stylet and Oral airway Placement Confirmation: ETT inserted through vocal cords under direct vision,  positive ETCO2 and breath sounds checked- equal and bilateral Secured at: 20 cm Tube secured with: Tape Dental Injury: Teeth and Oropharynx as per pre-operative assessment

## 2016-07-05 NOTE — Anesthesia Postprocedure Evaluation (Signed)
Anesthesia Post Note  Patient: April Holt  Procedure(s) Performed: Procedure(s) (LRB): ENDOSCOPIC RETROGRADE CHOLANGIOPANCREATOGRAPHY (ERCP) (N/A)  Patient location during evaluation: PACU Anesthesia Type: General Level of consciousness: awake and alert Pain management: pain level controlled Vital Signs Assessment: post-procedure vital signs reviewed and stable Respiratory status: spontaneous breathing, nonlabored ventilation, respiratory function stable and patient connected to nasal cannula oxygen Cardiovascular status: blood pressure returned to baseline and stable Postop Assessment: no signs of nausea or vomiting Anesthetic complications: no    Last Vitals:  Vitals:   07/05/16 1220 07/05/16 1230  BP: 139/69 (!) 140/56  Pulse: 69 69  Resp: 12 12  Temp:      Last Pain:  Vitals:   07/05/16 1145  TempSrc:   PainSc: 2                  Vaudine Dutan J

## 2016-07-05 NOTE — Discharge Instructions (Signed)

## 2016-07-05 NOTE — Interval H&P Note (Signed)
History and Physical Interval Note:  07/05/2016 10:03 AM  April Holt  has presented today for surgery, with the diagnosis of blockage in common bile duct  The various methods of treatment have been discussed with the patient and family. After consideration of risks, benefits and other options for treatment, the patient has consented to  Procedure(s): ENDOSCOPIC RETROGRADE CHOLANGIOPANCREATOGRAPHY (ERCP) (N/A) as a surgical intervention .  The patient's history has been reviewed, patient examined, no change in status, stable for surgery.  I have reviewed the patient's chart and labs.  Questions were answered to the patient's satisfaction.     Milus Banister

## 2016-07-06 ENCOUNTER — Encounter (HOSPITAL_COMMUNITY): Payer: Self-pay | Admitting: Gastroenterology

## 2016-11-05 HISTORY — PX: CATARACT EXTRACTION, BILATERAL: SHX1313

## 2016-11-24 ENCOUNTER — Encounter (HOSPITAL_COMMUNITY): Payer: Self-pay | Admitting: Emergency Medicine

## 2016-11-24 ENCOUNTER — Emergency Department (HOSPITAL_COMMUNITY): Payer: Medicare Other

## 2016-11-24 ENCOUNTER — Emergency Department (HOSPITAL_COMMUNITY)
Admission: EM | Admit: 2016-11-24 | Discharge: 2016-11-24 | Disposition: A | Discharge status: Home / Self Care | Payer: Medicare Other | Attending: Emergency Medicine | Admitting: Emergency Medicine

## 2016-11-24 DIAGNOSIS — Y929 Unspecified place or not applicable: Secondary | ICD-10-CM | POA: Diagnosis not present

## 2016-11-24 DIAGNOSIS — S0083XA Contusion of other part of head, initial encounter: Secondary | ICD-10-CM | POA: Insufficient documentation

## 2016-11-24 DIAGNOSIS — Y939 Activity, unspecified: Secondary | ICD-10-CM | POA: Insufficient documentation

## 2016-11-24 DIAGNOSIS — X509XXA Other and unspecified overexertion or strenuous movements or postures, initial encounter: Secondary | ICD-10-CM | POA: Insufficient documentation

## 2016-11-24 DIAGNOSIS — S99922A Unspecified injury of left foot, initial encounter: Secondary | ICD-10-CM | POA: Diagnosis present

## 2016-11-24 DIAGNOSIS — S9032XA Contusion of left foot, initial encounter: Secondary | ICD-10-CM | POA: Diagnosis not present

## 2016-11-24 DIAGNOSIS — Z85828 Personal history of other malignant neoplasm of skin: Secondary | ICD-10-CM | POA: Diagnosis not present

## 2016-11-24 DIAGNOSIS — Y999 Unspecified external cause status: Secondary | ICD-10-CM | POA: Diagnosis not present

## 2016-11-24 DIAGNOSIS — S0990XA Unspecified injury of head, initial encounter: Secondary | ICD-10-CM

## 2016-11-24 NOTE — ED Provider Notes (Signed)
[facility] DEPT Provider Note   CSN: [id number] Arrival date & time: 11/24/16  1247     History   Chief Complaint Chief Complaint  Patient presents with  . Fall  . Head Injury  . Foot Injury    HPI April Holt is a 76 y.o. female.  HPI Pt fell this morning after missing a step.   She hit her forehead on the door.  She also twisted her foot and has pain in her left foot.  It hurts to walk.  SHe developed a bruise in her left midfoot.   She developed a bruise on her left forehead.  She had some brief nausea but that resolved. SHe does not have a headache.  No LOC.  No vomiting. Past Medical History:  Diagnosis Date  . Arthritis    fingers, left knee  . Biliary colic   . Cholelithiasis   . Essential tremor    mostly head  . Glaucoma of both eyes   . Skin cancer, basal cell   . Wears glasses     Patient Active Problem List   Diagnosis Date Noted  . Essential tremor 01/28/2014    Past Surgical History:  Procedure Laterality Date  . CHOLECYSTECTOMY N/A 03/21/2016   Procedure: LAPAROSCOPIC CHOLECYSTECTOMY;  Surgeon: Mickeal Skinner, MD;  Location: [facility];  Service: General;  Laterality: N/A;  . COLONOSCOPY    . ERCP N/A 07/05/2016   Procedure: ENDOSCOPIC RETROGRADE CHOLANGIOPANCREATOGRAPHY (ERCP);  Surgeon: Milus Banister, MD;  Location: [facility];  Service: Endoscopy;  Laterality: N/A;  . LUMBAR SPINE SURGERY  1983   L5-S1  . TONSILLECTOMY AND ADENOIDECTOMY  age 87  . TUBAL LIGATION  1990's  . VAGINAL HYSTERECTOMY  1993  . WRIST GANGLION EXCISION Left yrs ago    OB History    No data available       Home Medications    Prior to Admission medications   Medication Sig Start Date End Date Taking? Authorizing Provider  Calcium Carb-Cholecalciferol (CALCIUM 1000 + D PO) Take 1 tablet by mouth 2 (two) times daily.    Historical Provider, MD  calcium carbonate (TUMS - DOSED IN MG ELEMENTAL CALCIUM) 500 MG chewable tablet Chew 1  tablet by mouth daily.    Historical Provider, MD  Flaxseed, Linseed, (FLAX SEED OIL) 1000 MG CAPS Take 1 capsule by mouth daily.    Historical Provider, MD  glucosamine-chondroitin 500-400 MG tablet Take 2 tablets by mouth daily.    Historical Provider, MD  latanoprost (XALATAN) 0.005 % ophthalmic solution Place 1 drop into both eyes at bedtime.  01/19/14   Historical Provider, MD  primidone (MYSOLINE) 250 MG tablet TAKE 1/2 TO 1 TABLET DAILY AS NEEDED. Patient taking differently: Take 125 mg by mouth every morning. TAKE 1/2 TO 1 TABLET DAILY AS NEEDED. 02/08/16   Star Age, MD    Family History Family History  Problem Relation Age of Onset  . Heart failure Father   . Stroke Mother   . Asthma Sister   . Breast cancer Sister     Social History Social History  Substance Use Topics  . Smoking status: Never Smoker  . Smokeless tobacco: Never Used  . Alcohol use 4.8 oz/week    4 Standard drinks or equivalent, 4 Glasses of wine per week     Comment: 1/2 glass wine 4 times week     Allergies   Adhesive [tape]; Penicillins; and Sulfa antibiotics   Review of Systems  Review of Systems   Physical Exam Updated Vital Signs BP 124/59   Pulse 70   Temp 97.8 F (36.6 C) (Oral)   Resp 17   Ht 5\' 6"  (1.676 m)   Wt 53.5 kg   SpO2 97%   BMI 19.05 kg/m   Physical Exam  Constitutional: She appears well-developed and well-nourished. No distress.  HENT:  Head: Normocephalic.  Right Ear: External ear normal.  Left Ear: External ear normal.  Small contusion left forehead, no step off. No tenderness in the periorbital region or zygomatic arch  Eyes: Conjunctivae are normal. Right eye exhibits no discharge. Left eye exhibits no discharge. No scleral icterus.  Neck: Neck supple. No tracheal deviation present.  Cardiovascular: Normal rate, regular rhythm and intact distal pulses.   Pulmonary/Chest: Effort normal and breath sounds normal. No stridor. No respiratory distress. She has no  wheezes. She has no rales.  Abdominal: Soft. Bowel sounds are normal. She exhibits no distension. There is no tenderness. There is no rebound and no guarding.  Musculoskeletal: She exhibits tenderness. She exhibits no edema.       Feet:  Small area of bruising left medial aspect of the midfoot, no gross deformity, no tenderness to palpation of the medial or lateral malleolus, no tenderness palpation fifth metatarsal; no tenderness elsewhere in the spine or bilateral upper extremities or right lower extremity  Neurological: She is alert. She has normal strength. No cranial nerve deficit (no facial droop, extraocular movements intact, no slurred speech) or sensory deficit. She exhibits normal muscle tone. She displays no seizure activity. Coordination normal.  Skin: Skin is warm and dry. No rash noted.  Psychiatric: She has a normal mood and affect.  Nursing note and vitals reviewed.    ED Treatments / Results    Radiology Dg Foot Complete Left  Result Date: 11/24/2016 CLINICAL DATA:  Pain after a trauma. EXAM: LEFT FOOT - COMPLETE 3+ VIEW COMPARISON:  None. FINDINGS: Hallux valgus deformity. Mild irregularity at the base of the fifth metatarsal may represent enthesopathic change or sequela of previous avulsion injury. No acute fractures are seen today. IMPRESSION: No acute abnormality identified. Electronically Signed   By: Dorise Bullion III M.D   On: 11/24/2016 17:25    Procedures Procedures (including critical care time)  Medications Ordered in ED Medications - No data to display   Initial Impression / Assessment and Plan / ED Course  I have reviewed the triage vital signs and the nursing notes.  Pertinent labs & imaging results that were available during my care of the patient were reviewed by me and considered in my medical decision making (see chart for details).    No evidence of serious injury associated with the fall.  Consistent with soft tissue injury/strain.  Explained  findings to patient and warning signs that should prompt return to the ED.  Regarding her head injury, the patient did not have any loss of consciousness. She is not on any anticoagulants. She has not had any nausea vomiting or neurologic symptoms. I doubt any serious head injury.    Final Clinical Impressions(s) / ED Diagnoses   Final diagnoses:  Injury of head, initial encounter  Contusion of left foot, initial encounter    New Prescriptions New Prescriptions   No medications on file     Dorie Rank, MD 11/24/16 1811

## 2016-11-24 NOTE — Discharge Instructions (Signed)
°  Apply ice to help with the swelling take over-the-counter medications as needed for pain, use the crutches and the splint as needed for support. Follow up with an orthopedic doctor in 1-2 week if the symptoms have not resolved.,

## 2016-11-24 NOTE — ED Notes (Signed)
Patient transported to X-ray 

## 2016-11-24 NOTE — ED Notes (Signed)
Pt is in stable condition upon d/c and is escorted from ED via wheelchair. 

## 2016-11-24 NOTE — ED Triage Notes (Signed)
Pt. Stated, I was spitting some seeds and I missed a step and fell to my left side , hit my head on the left forehead and twisted my left foot at the arch. No LOC and pt is not on any blood thinners.

## 2016-11-24 NOTE — ED Notes (Signed)
New bags of ice given to patient for head and foot.

## 2017-01-30 ENCOUNTER — Encounter: Payer: Self-pay | Admitting: Neurology

## 2017-01-31 ENCOUNTER — Telehealth: Payer: Self-pay | Admitting: Nurse Practitioner

## 2017-01-31 NOTE — Telephone Encounter (Signed)
Pt rec'd Vm to r/s appt on 4/5 with Dr Rexene Alberts, she will be out of the office. Dr Rexene Alberts is booked out to 6/19. This is a yearly appt for tremors. An appt was made with Hoyle Sauer for 4/11.  FYI

## 2017-01-31 NOTE — Telephone Encounter (Signed)
Noted  

## 2017-02-07 ENCOUNTER — Ambulatory Visit: Payer: Medicare Other | Admitting: Neurology

## 2017-02-13 ENCOUNTER — Ambulatory Visit (INDEPENDENT_AMBULATORY_CARE_PROVIDER_SITE_OTHER): Payer: Medicare Other | Admitting: Nurse Practitioner

## 2017-02-13 ENCOUNTER — Encounter (INDEPENDENT_AMBULATORY_CARE_PROVIDER_SITE_OTHER): Payer: Self-pay

## 2017-02-13 ENCOUNTER — Encounter: Payer: Self-pay | Admitting: Nurse Practitioner

## 2017-02-13 DIAGNOSIS — G250 Essential tremor: Secondary | ICD-10-CM

## 2017-02-13 MED ORDER — PRIMIDONE 250 MG PO TABS: ORAL_TABLET | ORAL | 3 refills | Status: AC

## 2017-02-13 NOTE — Patient Instructions (Signed)
Continue Mysoline at current dose  Discussed weighted utensils Follow up yearly

## 2017-02-13 NOTE — Progress Notes (Addendum)
[facility]  PATIENT: MONNA CREAN DOB: [birth year]   REASON FOR VISIT: Follow-up for essential tremor HISTORY FROM: Patient    HISTORY OF PRESENT ILLNESS:Ms. Goodine is a very pleasant 76 year old right-handed woman with an underlying medical history of seasonal allergies, and glaucoma, who presents for followup consultation of her essential tremor, affecting primarily her head. She is unaccompanied today. I last saw her on 02/08/2015, at which time she reported feeling stable. She noticed no significant worsening of her tremors. She was playing the harp. She did not notice any significant hand tremors. She felt her fine motor skills are fine. Balance at times was not as good but she had no recent falls and no major problems. She did trip off a curb in December 2015. This was because the ground was wet and never leaves on the curb and she did not see the edge of it. She also reported a recent neck pain with a crick in the neck. She used heat and ice on it. I suggested we continue with 125 mg daily, up to 250 mg if needed.   Today, 02/08/2016: She reports doing well with regards to her tremors. Still taking half a pill of Mysoline once a day, occasionally a whole pill. She has been tolerating it well, she has not noticed any exacerbation of her tremors and no significant hand tremors. She has been working on her balance and has been doing yoga. She has had some gallbladder problems. She is to see a Psychologist, sport and exercise for this. UPDATE 04/11/2018CM Ms. Darden, 76 year old female returns for follow-up with a history of essential tremor. She remains on Mysoline taking half pill some days and a whole pill some days. She denies any side effects to the medication. She does have some balance issues when she is trying to work on that. She denies any recent falls. She plays the harp, without difficulty. She feels her fine motor skills are fine She returns for reevaluation. Recent cataract removal in  February and April.    REVIEW OF SYSTEMS: Full 14 system review of systems performed and notable only for those listed, all others are neg:  Constitutional: neg  Cardiovascular: neg Ear/Nose/Throat: neg  Skin: neg Eyes: neg Respiratory: neg Gastroitestinal: neg  Hematology/Lymphatic: neg  Endocrine: neg Musculoskeletal: Joint pain Allergy/Immunology: Environmental allergies Neurological: neg Psychiatric: neg Sleep : neg   ALLERGIES: Allergies  Allergen Reactions  . Adhesive [Tape] Other (See Comments)    "red blotches"  . Penicillins Other (See Comments)    Dizzy and sweaty  . Sulfa Antibiotics Nausea And Vomiting    HOME MEDICATIONS: Outpatient Medications Prior to Visit  Medication Sig Dispense Refill  . Calcium Carb-Cholecalciferol (CALCIUM 1000 + D PO) Take 1 tablet by mouth 2 (two) times daily.    . Flaxseed, Linseed, (FLAX SEED OIL) 1000 MG CAPS Take 1 capsule by mouth daily.    Marland Kitchen glucosamine-chondroitin 500-400 MG tablet Take 2 tablets by mouth daily.    Marland Kitchen latanoprost (XALATAN) 0.005 % ophthalmic solution Place 1 drop into both eyes at bedtime.     . primidone (MYSOLINE) 250 MG tablet TAKE 1/2 TO 1 TABLET DAILY AS NEEDED. (Patient taking differently: Take 125 mg by mouth every morning. TAKE 1/2 TO 1 TABLET DAILY AS NEEDED.) 90 tablet 3  . calcium carbonate (TUMS - DOSED IN MG ELEMENTAL CALCIUM) 500 MG chewable tablet Chew 1 tablet by mouth daily.     No facility-administered medications prior to visit.     PAST  MEDICAL HISTORY: Past Medical History:  Diagnosis Date  . Arthritis    fingers, left knee  . Biliary colic   . Cholelithiasis   . Essential tremor    mostly head  . Glaucoma of both eyes   . Skin cancer, basal cell   . Wears glasses     PAST SURGICAL HISTORY: Past Surgical History:  Procedure Laterality Date  . CATARACT EXTRACTION, BILATERAL  2018  . CHOLECYSTECTOMY N/A 03/21/2016   Procedure: LAPAROSCOPIC CHOLECYSTECTOMY;  Surgeon: Mickeal Skinner, MD;  Location: [facility];  Service: General;  Laterality: N/A;  . COLONOSCOPY    . ERCP N/A 07/05/2016   Procedure: ENDOSCOPIC RETROGRADE CHOLANGIOPANCREATOGRAPHY (ERCP);  Surgeon: Milus Banister, MD;  Location: [facility];  Service: Endoscopy;  Laterality: N/A;  . LUMBAR SPINE SURGERY  1983   L5-S1  . TONSILLECTOMY AND ADENOIDECTOMY  age 76  . TUBAL LIGATION  1990's  . VAGINAL HYSTERECTOMY  1993  . WRIST GANGLION EXCISION Left yrs ago    FAMILY HISTORY: Family History  Problem Relation Age of Onset  . Heart failure Father   . Stroke Mother   . Asthma Sister   . Breast cancer Sister     SOCIAL HISTORY: Social History   Social History  . Marital status: Married    Spouse name: N/A  . Number of children: N/A  . Years of education: N/A   Occupational History  . retired    Social History Main Topics  . Smoking status: Never Smoker  . Smokeless tobacco: Never Used  . Alcohol use 4.8 oz/week    4 Standard drinks or equivalent, 4 Glasses of wine per week     Comment: 1/2 glass wine 4 times week  . Drug use: No  . Sexual activity: Not on file   Other Topics Concern  . Not on file   Social History Narrative   Pt lives at home with her husband of 23 years, Sidni Fusco; they have 6 children. 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     PHYSICAL EXAM  Vitals:   02/13/17 1454  BP: 109/60  Pulse: 80  Weight: 121 lb 12.8 oz (55.2 kg)   Body mass index is 19.66 kg/m.  Generalized: Well developed, in no acute distress, well-groomed  Head: normocephalic and atraumatic,. Oropharynx benign  Neck: Supple, no carotid bruits  Cardiac: Regular rate rhythm, no murmur  Musculoskeletal: No deformity   Neurological examination   Mentation: Alert oriented to time, place, history taking. Attention span and concentration appropriate. Recent and remote memory intact.  Follows all commands speech and language fluent.   Cranial nerve  II-XII: Pupils were equal round reactive to light extraocular movements were full, visual field were full on confrontational test. Facial sensation and strength were normal. hearing was intact to finger rubbing bilaterally. Uvula tongue midline. head turning and shoulder shrug were normal and symmetric.Tongue protrusion into cheek strength was normal. Side to side head tremor is mild Motor: normal bulk and tone, full strength in the BUE, BLE, fine finger movements normal, no pronator drift. No focal weakness Sensory: normal and symmetric to light touch, pinprick, and  Vibration, in the upper and lower extremities Coordination: finger-nose-finger, heel-to-shin bilaterally, no dysmetria, no intention tremor Reflexes: Brachioradialis 2/2, biceps 2/2, triceps 2/2, patellar 2/2, Achilles 2/2, plantar responses were flexor bilaterally. Gait and Station: Rising up from seated position without assistance, normal stance,  moderate stride, good arm swing, smooth turning, able to  perform tiptoe, and heel walking without difficulty. Tandem gait is steady  DIAGNOSTIC DATA (LABS, IMAGING, TESTING) - I reviewed patient records, labs, notes, testing and imaging myself where available.  Lab Results  Component Value Date   WBC 5.2 03/13/2016   HGB 13.5 03/13/2016   HCT 41.0 03/13/2016   MCV 89.1 03/13/2016   PLT 232 03/13/2016      Component Value Date/Time   NA 142 03/13/2016 0809   K 4.3 03/13/2016 0809   CL 106 03/13/2016 0809   CO2 29 03/13/2016 0809   GLUCOSE 106 (H) 03/13/2016 0809   BUN 10 03/13/2016 0809   CREATININE 0.79 03/13/2016 0809   CALCIUM 9.2 03/13/2016 0809   PROT 6.6 03/13/2016 0809   ALBUMIN 4.2 03/13/2016 0809   AST 29 03/13/2016 0809   ALT 26 03/13/2016 0809   ALKPHOS 41 03/13/2016 0809   BILITOT 0.4 03/13/2016 0809   GFRNONAA >60 03/13/2016 0809   GFRAA >60 03/13/2016 0809    ASSESSMENT AND PLAN  76 y.o. year old female  has a past medical history of An essential tremor  which manifests primarily as a head tremor. There is a family history of tremor.   Continue Mysoline at current dose will refill Discussed weighted utensils if hand tremor begins Follow up yearly I spent 15 min  in total face to face time with the patient more than 50% of which was spent counseling and coordination of care, reviewing test results reviewing medications and discussing and reviewing the diagnosis of essential tremor, prognosis and further treatment options.,  Dennie Bible, [facility], [facility], APRN  [facility] [address], [place], [place] [address] [phone]  I reviewed the above note and documentation by the Nurse Practitioner and agree with the history, physical exam, assessment and plan as outlined above. I was immediately available for face-to-face consultation. Star Age, MD, PhD [facility])

## 2017-02-19 ENCOUNTER — Other Ambulatory Visit: Payer: Self-pay | Admitting: Neurology

## 2017-02-19 DIAGNOSIS — G250 Essential tremor: Secondary | ICD-10-CM

## 2017-11-01 IMAGING — MR MR MRCP
7 of 12 series · 22 of 48 positions shown · non-contrast
Comparison: None.

CLINICAL DATA: Status post cholecystectomy. Abdominal/chest pain.
Evaluate for choledocholithiasis.

EXAM:
MRI ABDOMEN WITHOUT CONTRAST  (INCLUDING MRCP)
TECHNIQUE: Multiplanar multisequence MR imaging of the abdomen was performed.
Heavily T2-weighted images of the biliary and pancreatic ducts were
obtained, and three-dimensional MRCP images were rendered by post
processing.

[Series 5: T2 fat-sat · axial · 5.0mm · 0.70mm/px · z∈[-56,+169]mm · 3 of 46 slices shown]
[im 1/46]
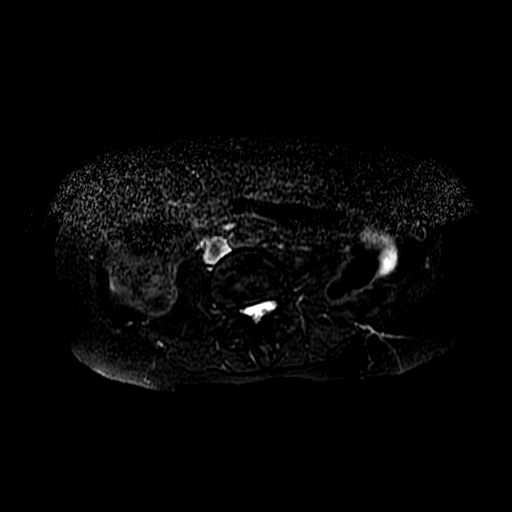
[im 23/46]
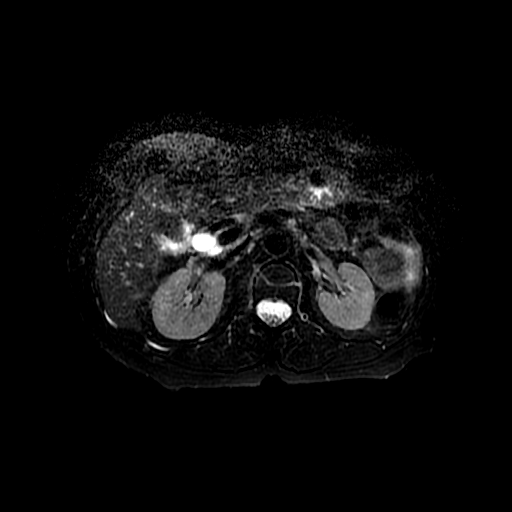
[im 46/46]
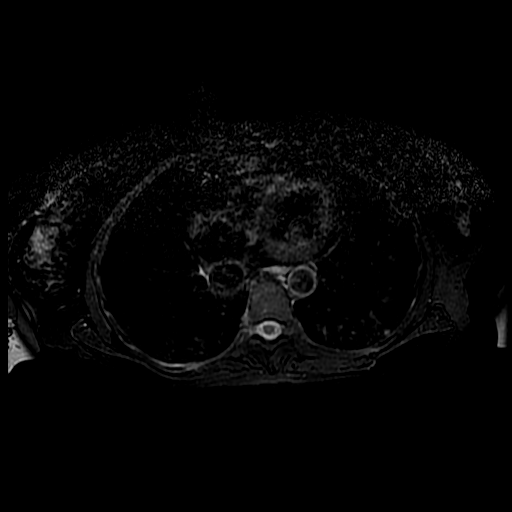

[Series 8: MRCP · coronal · 2.0mm · 0.70mm/px · 2 of 30 slices shown (1 of 2)]
[im 1/30]
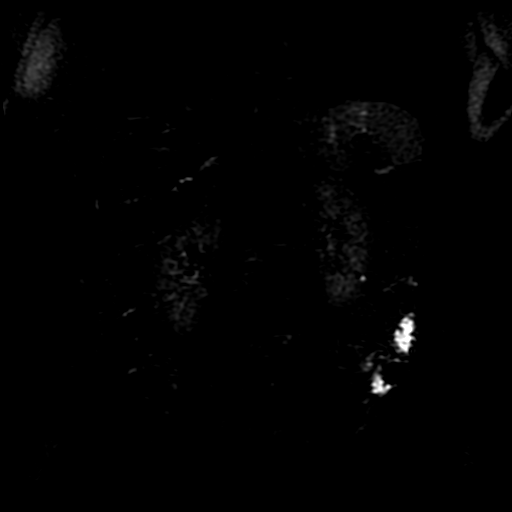
[im 30/30]
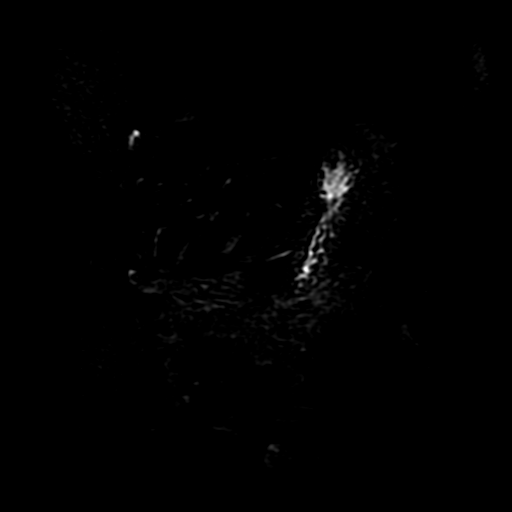

[Series 9: DWI b500 · axial · 6.0mm · 1.37mm/px · z∈[-82,+144]mm · 4 of 60 slices shown]
[im 1/60]
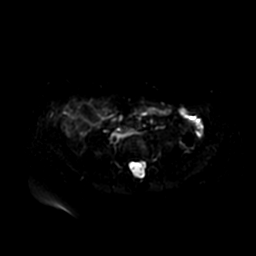
[im 20/60]
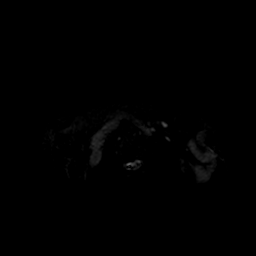
[im 40/60]
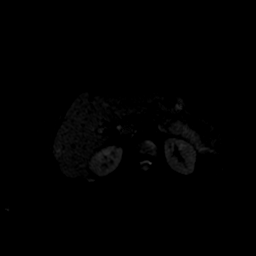
[im 60/60]
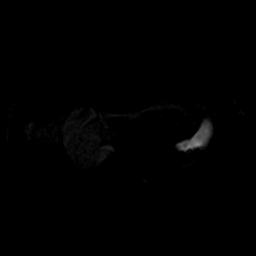

[Series 10: ax dualecho · axial · 5.0mm · 0.70mm/px · z∈[-78,+152]mm · 7 of 94 slices shown]
[im 1/94]
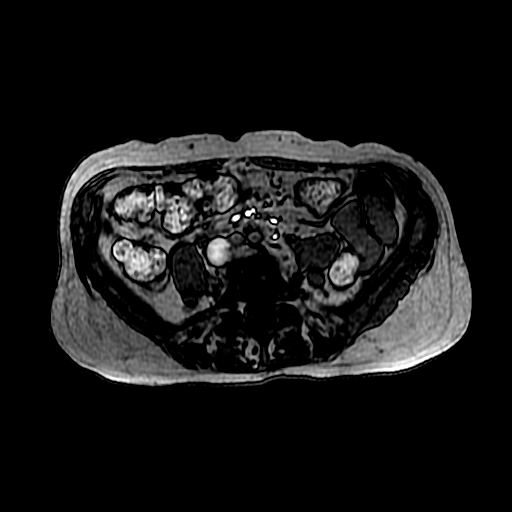
[im 16/94]
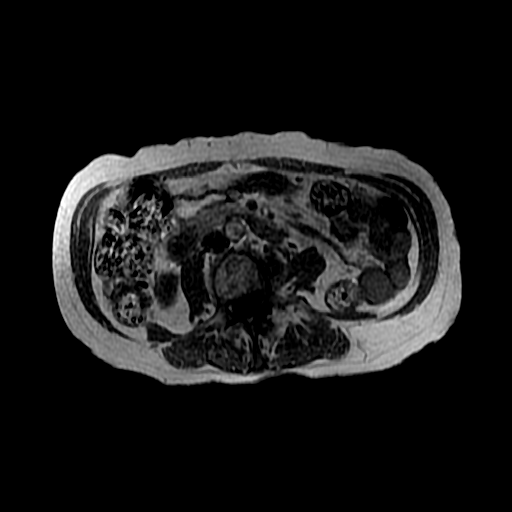
[im 32/94]
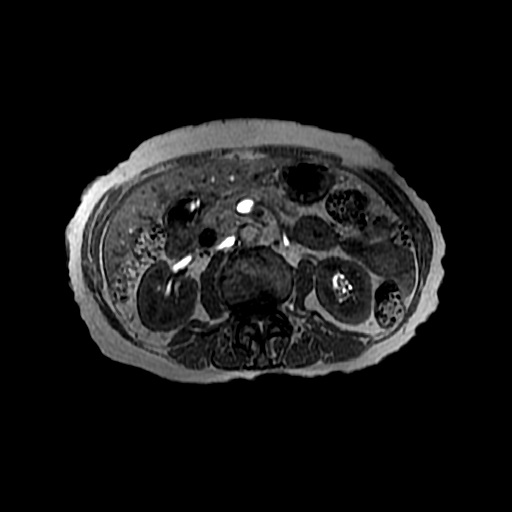
[im 47/94]
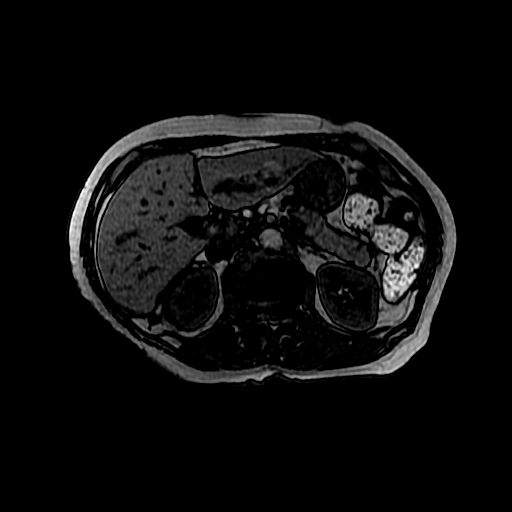
[im 63/94]
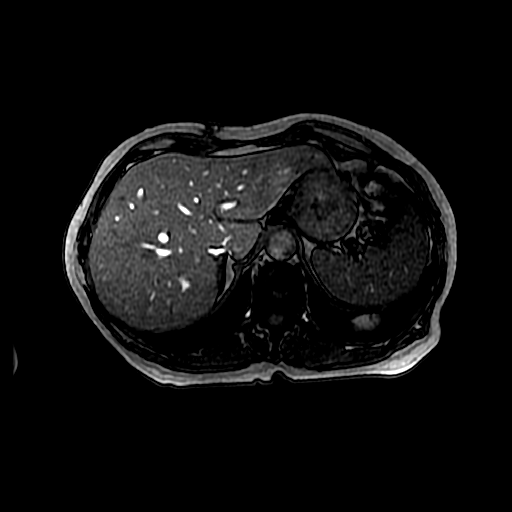
[im 78/94]
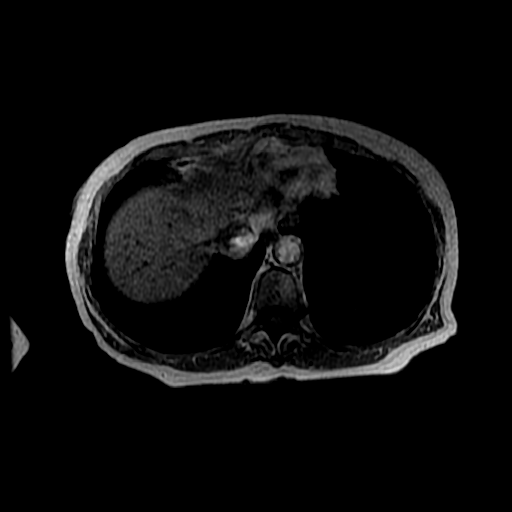
[im 94/94]
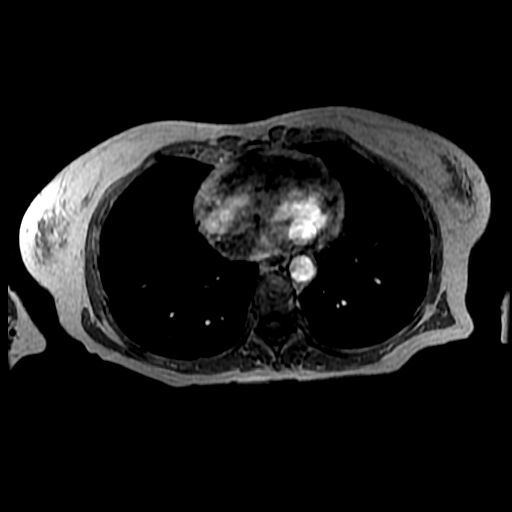

[Series 11: MRCP · coronal · 40.0mm · 0.70mm/px · 1 of 5 slices shown (2 of 2)]
[im 1/5]
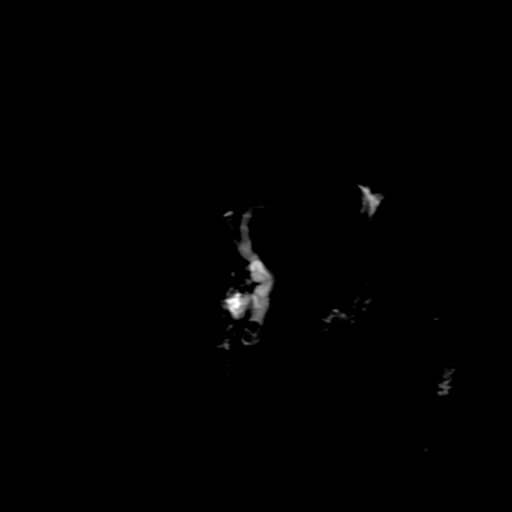

[Series 12: bSSFP fat-sat · coronal · 5.0mm · 0.70mm/px · 2 of 34 slices shown]
[im 1/34]
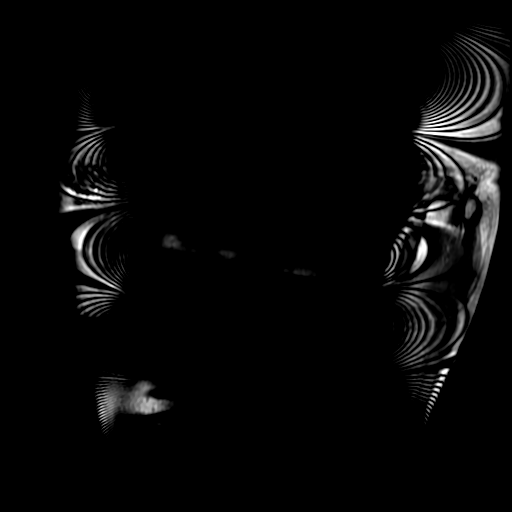
[im 34/34]
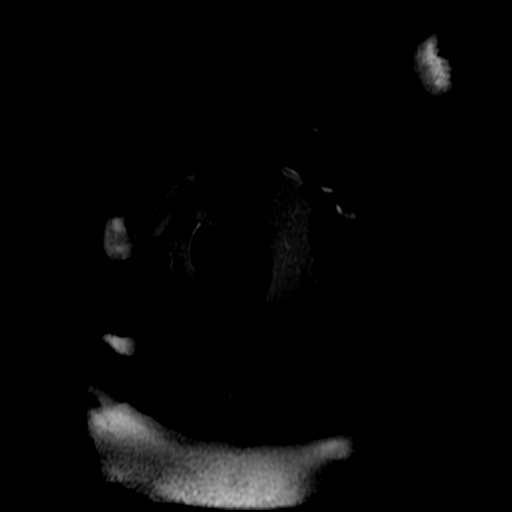

[Series 13: T2 · axial · 5.0mm · 0.70mm/px · z∈[-78,+152]mm · 3 of 47 slices shown]
[im 1/47]
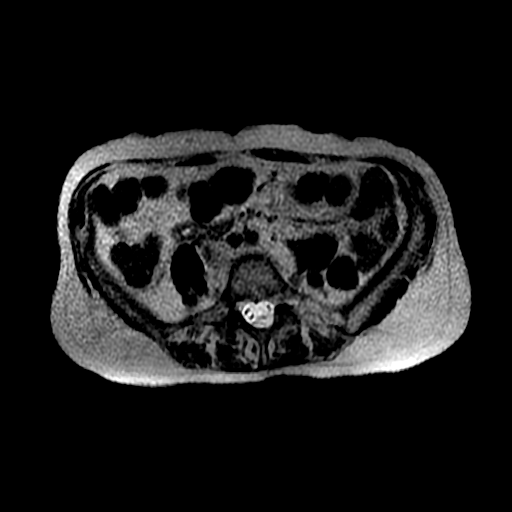
[im 24/47]
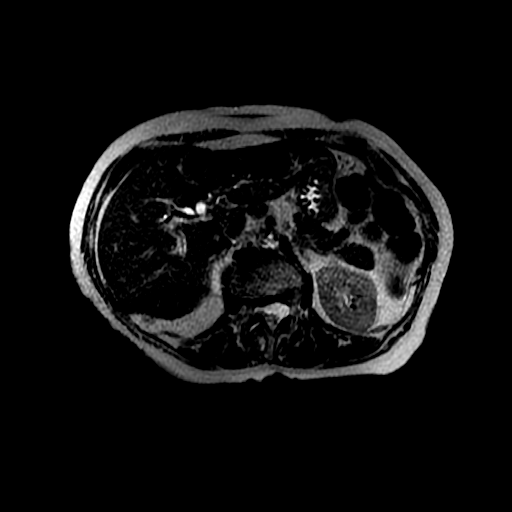
[im 47/47]
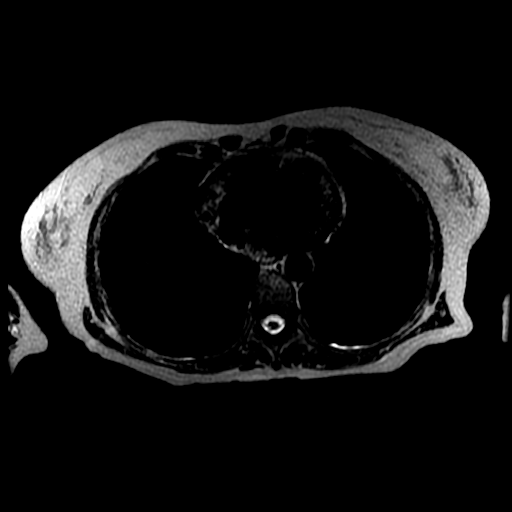

[22 of 48 positions shown; findings below may reference images not displayed]

FINDINGS: Lower chest:  Lung bases are clear.

Hepatobiliary: Liver is within normal limits. No focal hepatic
lesion is seen. No hepatic steatosis.

Status post cholecystectomy. Mild prominence of the central left
hepatic duct. Dilated common duct, measuring 12 mm. Two stones in
the mid common duct measuring up to 9 mm (series 7/ image 35).

Pancreas: Within normal limits.

Spleen: Within normal limits.

Adrenals/Urinary Tract: Adrenal glands are within normal limits.

Kidneys are within normal limits.  No hydronephrosis.

Stomach/Bowel: Stomach and visualized bowel are unremarkable.

Vascular/Lymphatic: No evidence of abdominal aortic aneurysm.

No suspicious abdominal lymphadenopathy.

Other: No abdominal ascites.

Musculoskeletal: Mild lumbar dextroscoliosis with associated
degenerative changes.
IMPRESSION: Status post cholecystectomy.

Mild intrahepatic and extrahepatic ductal dilatation. Common duct
measures 12 mm.

Choledocholithiasis with two stones in the mid common duct measuring
up to 9 mm.

## 2018-02-26 ENCOUNTER — Ambulatory Visit: Payer: Medicare Other | Admitting: Nurse Practitioner

## 2018-03-30 ENCOUNTER — Other Ambulatory Visit: Payer: Self-pay | Admitting: Nurse Practitioner

## 2018-03-30 DIAGNOSIS — G250 Essential tremor: Secondary | ICD-10-CM
